# Patient Record
Sex: Male | Born: 1980 | Race: White | Hispanic: No | Marital: Married | Smoking: Former smoker
Health system: Southern US, Community
[De-identification: ages and names within clinical notes are randomized; demographics above are authoritative.]

## PROBLEM LIST (undated history)

## (undated) DIAGNOSIS — F319 Bipolar disorder, unspecified: Secondary | ICD-10-CM

## (undated) DIAGNOSIS — F410 Panic disorder [episodic paroxysmal anxiety] without agoraphobia: Secondary | ICD-10-CM

## (undated) DIAGNOSIS — F329 Major depressive disorder, single episode, unspecified: Secondary | ICD-10-CM

## (undated) DIAGNOSIS — F32A Depression, unspecified: Secondary | ICD-10-CM

## (undated) HISTORY — DX: Depression, unspecified: F32.A

## (undated) HISTORY — DX: Panic disorder (episodic paroxysmal anxiety): F41.0

## (undated) HISTORY — DX: Major depressive disorder, single episode, unspecified: F32.9

## (undated) HISTORY — PX: TONSILLECTOMY: SHX5217

## (undated) HISTORY — DX: Bipolar disorder, unspecified: F31.9

---

## 2004-10-06 ENCOUNTER — Emergency Department: Payer: Self-pay | Admitting: Unknown Physician Specialty

## 2006-02-06 ENCOUNTER — Emergency Department: Payer: Self-pay | Admitting: Emergency Medicine

## 2010-08-01 ENCOUNTER — Emergency Department (HOSPITAL_COMMUNITY): Admission: EM | Admit: 2010-08-01 | Discharge: 2010-08-01 | Payer: Self-pay | Admitting: Emergency Medicine

## 2010-08-06 ENCOUNTER — Encounter: Payer: Self-pay | Admitting: Gastroenterology

## 2010-08-06 ENCOUNTER — Ambulatory Visit: Payer: Self-pay | Admitting: Gastroenterology

## 2010-08-06 DIAGNOSIS — K219 Gastro-esophageal reflux disease without esophagitis: Secondary | ICD-10-CM

## 2010-08-06 DIAGNOSIS — K859 Acute pancreatitis without necrosis or infection, unspecified: Secondary | ICD-10-CM | POA: Insufficient documentation

## 2010-08-06 DIAGNOSIS — R1013 Epigastric pain: Secondary | ICD-10-CM

## 2010-08-07 ENCOUNTER — Encounter: Payer: Self-pay | Admitting: Gastroenterology

## 2010-08-09 ENCOUNTER — Encounter (INDEPENDENT_AMBULATORY_CARE_PROVIDER_SITE_OTHER): Payer: Self-pay | Admitting: *Deleted
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
## 2010-10-24 NOTE — Letter (Signed)
Summary: Recall Radiology  [facility]  [address]   [place], [place] [address]   Phone: [phone]  Fax: [phone]    August 09, 2010  JAXSUN CIAMPI [address] [place] [address] [place], [place]  [address] 06/22/1981   Dear Mr. BELLEROSE,   Our office needs to get you scheduled for your Upper Endoscopy. Please give our office a call to schedule this.  You may call the office at your convenience at [phone].  Please ask for the Referral Coordinator to make arrangements for this to be scheduled.  You may have to leave a message on our voice mail.  We will return your call.  If for any reason you do not wish to schedule this, please advise the office.  Please do not neglect your health.   Thank you,    Ave Filter  [facility] Ph: [phone]   Fax: [phone]

## 2010-10-24 NOTE — Letter (Signed)
Summary: ER REFERRAL  ER REFERRAL   Imported By: Rexene Alberts 08/07/2010 11:07:48  _____________________________________________________________________  External Attachment:    Type:   Image     Comment:   External Document

## 2010-10-24 NOTE — Miscellaneous (Signed)
Summary: Orders Update  Clinical Lists Changes  Orders: Added new Test order of T-Lipid Profile ([id number]) - Signed 

## 2010-12-04 LAB — DIFFERENTIAL
Eosinophils Absolute: 0.2 10*3/uL (ref 0.0–0.7)
Lymphs Abs: 2.9 10*3/uL (ref 0.7–4.0)
Monocytes Relative: 8 % (ref 3–12)
Neutrophils Relative %: 66 % (ref 43–77)

## 2010-12-04 LAB — LIPASE, BLOOD: Lipase: 80 U/L — ABNORMAL HIGH (ref 11–59)

## 2010-12-04 LAB — BASIC METABOLIC PANEL
CO2: 23 mEq/L (ref 19–32)
Calcium: 9.3 mg/dL (ref 8.4–10.5)
Chloride: 96 mEq/L (ref 96–112)
GFR calc Af Amer: >60 mL/min (ref >60)
Potassium: 3.5 mEq/L (ref 3.5–5.1)
Sodium: 139 mEq/L (ref 135–145)

## 2010-12-04 LAB — CBC
Hemoglobin: 15.8 g/dL (ref 13.0–17.0)
MCH: 32.1 pg (ref 26.0–34.0)
MCV: 94.3 fL (ref 78.0–100.0)
RBC: 4.93 MIL/uL (ref 4.22–5.81)

## 2010-12-04 LAB — HEPATIC FUNCTION PANEL
Albumin: 4.4 g/dL (ref 3.5–5.2)
Alkaline Phosphatase: 79 U/L (ref 39–117)
Total Bilirubin: 0.8 mg/dL (ref 0.3–1.2)

## 2013-04-13 ENCOUNTER — Ambulatory Visit: Payer: Self-pay | Admitting: Family Medicine

## 2013-12-27 ENCOUNTER — Emergency Department: Payer: Self-pay | Admitting: Emergency Medicine

## 2013-12-27 LAB — URINALYSIS, COMPLETE
BACTERIA: NONE SEEN
BILIRUBIN, UR: NEGATIVE
Blood: NEGATIVE
GLUCOSE, UR: NEGATIVE mg/dL (ref 0–75)
LEUKOCYTE ESTERASE: NEGATIVE
Nitrite: NEGATIVE
PH: 5 (ref 4.5–8.0)
PROTEIN: NEGATIVE
RBC, UR: NONE SEEN /HPF (ref 0–5)
SQUAMOUS EPITHELIAL: NONE SEEN
Specific Gravity: 1.017 (ref 1.003–1.030)
WBC UR: 1 /HPF (ref 0–5)

## 2013-12-27 LAB — DRUG SCREEN, URINE
Amphetamines, Ur Screen: NEGATIVE (ref ?–1000)
BENZODIAZEPINE, UR SCRN: NEGATIVE (ref ?–200)
Barbiturates, Ur Screen: NEGATIVE (ref ?–200)
CANNABINOID 50 NG, UR ~~LOC~~: NEGATIVE (ref ?–50)
COCAINE METABOLITE, UR ~~LOC~~: NEGATIVE (ref ?–300)
MDMA (Ecstasy)Ur Screen: NEGATIVE (ref ?–500)
METHADONE, UR SCREEN: NEGATIVE (ref ?–300)
Opiate, Ur Screen: NEGATIVE (ref ?–300)
Phencyclidine (PCP) Ur S: NEGATIVE (ref ?–25)
Tricyclic, Ur Screen: NEGATIVE (ref ?–1000)

## 2013-12-27 LAB — COMPREHENSIVE METABOLIC PANEL
ALBUMIN: 4.2 g/dL (ref 3.4–5.0)
ALK PHOS: 75 U/L
ALT: 40 U/L (ref 12–78)
ANION GAP: 7 (ref 7–16)
BUN: 11 mg/dL (ref 7–18)
Bilirubin,Total: 0.6 mg/dL (ref 0.2–1.0)
CHLORIDE: 106 mmol/L (ref 98–107)
CO2: 26 mmol/L (ref 21–32)
Calcium, Total: 9 mg/dL (ref 8.5–10.1)
Creatinine: 1.24 mg/dL (ref 0.60–1.30)
EGFR (African American): >60
Glucose: 109 mg/dL — ABNORMAL HIGH (ref 65–99)
Osmolality: 278 (ref 275–301)
Potassium: 3.6 mmol/L (ref 3.5–5.1)
SGOT(AST): 29 U/L (ref 15–37)
SODIUM: 139 mmol/L (ref 136–145)
Total Protein: 7.7 g/dL (ref 6.4–8.2)

## 2013-12-27 LAB — SALICYLATE LEVEL

## 2013-12-27 LAB — ETHANOL

## 2013-12-27 LAB — CBC
HCT: 43.6 % (ref 40.0–52.0)
HGB: 14.7 g/dL (ref 13.0–18.0)
MCH: 30.5 pg (ref 26.0–34.0)
MCHC: 33.7 g/dL (ref 32.0–36.0)
MCV: 91 fL (ref 80–100)
Platelet: 309 10*3/uL (ref 150–440)
RBC: 4.81 10*6/uL (ref 4.40–5.90)
RDW: 12.1 % (ref 11.5–14.5)
WBC: 9.9 10*3/uL (ref 3.8–10.6)

## 2013-12-27 LAB — ACETAMINOPHEN LEVEL

## 2014-08-04 LAB — CBC
HCT: 46.2 % (ref 40.0–52.0)
HGB: 15.3 g/dL (ref 13.0–18.0)
MCH: 30.9 pg (ref 26.0–34.0)
MCHC: 33.2 g/dL (ref 32.0–36.0)
MCV: 93 fL (ref 80–100)
Platelet: 298 10*3/uL (ref 150–440)
RBC: 4.96 10*6/uL (ref 4.40–5.90)
RDW: 12.3 % (ref 11.5–14.5)
WBC: 9.9 10*3/uL (ref 3.8–10.6)

## 2014-08-04 LAB — COMPREHENSIVE METABOLIC PANEL
ANION GAP: 7 (ref 7–16)
AST: 23 U/L (ref 15–37)
Albumin: 4.3 g/dL (ref 3.4–5.0)
Alkaline Phosphatase: 81 U/L
BUN: 11 mg/dL (ref 7–18)
Bilirubin,Total: 0.5 mg/dL (ref 0.2–1.0)
CO2: 27 mmol/L (ref 21–32)
CREATININE: 1.45 mg/dL — AB (ref 0.60–1.30)
Calcium, Total: 9.1 mg/dL (ref 8.5–10.1)
Chloride: 104 mmol/L (ref 98–107)
GLUCOSE: 115 mg/dL — AB (ref 65–99)
OSMOLALITY: 276 (ref 275–301)
Potassium: 3.6 mmol/L (ref 3.5–5.1)
SGPT (ALT): 50 U/L
SODIUM: 138 mmol/L (ref 136–145)
TOTAL PROTEIN: 7.9 g/dL (ref 6.4–8.2)

## 2014-08-04 LAB — DRUG SCREEN, URINE
Amphetamines, Ur Screen: NEGATIVE (ref ?–1000)
BENZODIAZEPINE, UR SCRN: NEGATIVE (ref ?–200)
Barbiturates, Ur Screen: NEGATIVE (ref ?–200)
CANNABINOID 50 NG, UR ~~LOC~~: NEGATIVE (ref ?–50)
COCAINE METABOLITE, UR ~~LOC~~: NEGATIVE (ref ?–300)
MDMA (Ecstasy)Ur Screen: NEGATIVE (ref ?–500)
Methadone, Ur Screen: NEGATIVE (ref ?–300)
Opiate, Ur Screen: NEGATIVE (ref ?–300)
Phencyclidine (PCP) Ur S: NEGATIVE (ref ?–25)
TRICYCLIC, UR SCREEN: NEGATIVE (ref ?–1000)

## 2014-08-04 LAB — URINALYSIS, COMPLETE
BILIRUBIN, UR: NEGATIVE
Bacteria: NONE SEEN
Blood: NEGATIVE
Glucose,UR: NEGATIVE mg/dL (ref 0–75)
Hyaline Cast: 3
KETONE: NEGATIVE
Leukocyte Esterase: NEGATIVE
Nitrite: NEGATIVE
PH: 6 (ref 4.5–8.0)
Specific Gravity: 1.024 (ref 1.003–1.030)
Squamous Epithelial: <1
WBC UR: 8 /HPF (ref 0–5)

## 2014-08-04 LAB — SALICYLATE LEVEL: Salicylates, Serum: <1.7 mg/dL

## 2014-08-04 LAB — ETHANOL

## 2014-08-04 LAB — ACETAMINOPHEN LEVEL: Acetaminophen: <2 ug/mL

## 2014-08-05 ENCOUNTER — Inpatient Hospital Stay: Payer: Self-pay | Admitting: Psychiatry

## 2014-08-05 LAB — LITHIUM LEVEL: Lithium: 0.23 mmol/L — ABNORMAL LOW

## 2014-08-06 LAB — URINE CULTURE

## 2014-08-09 LAB — LITHIUM LEVEL: Lithium: 0.58 mmol/L — ABNORMAL LOW

## 2014-08-09 LAB — LIPID PANEL
Cholesterol: 172 mg/dL
HDL Cholesterol: 27 mg/dL — ABNORMAL LOW
Ldl Cholesterol, Calc: 96 mg/dL
Triglycerides: 247 mg/dL — ABNORMAL HIGH
VLDL Cholesterol, Calc: 49 mg/dL — ABNORMAL HIGH

## 2014-08-09 LAB — HEMOGLOBIN A1C: HEMOGLOBIN A1C: 5.4 % (ref 4.2–6.3)
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
## 2015-03-08 ENCOUNTER — Ambulatory Visit (INDEPENDENT_AMBULATORY_CARE_PROVIDER_SITE_OTHER): Payer: Self-pay | Admitting: Psychiatry

## 2015-03-08 ENCOUNTER — Encounter: Payer: Self-pay | Admitting: Psychiatry

## 2015-03-08 VITALS — BP 123/78 | HR 67 | Resp 14 | Ht 72.0 in

## 2015-03-08 DIAGNOSIS — F3130 Bipolar disorder, current episode depressed, mild or moderate severity, unspecified: Secondary | ICD-10-CM

## 2015-03-08 MED ORDER — SERTRALINE HCL 100 MG PO TABS: 150.0000 mg | ORAL_TABLET | Freq: Every day | ORAL | Status: DC

## 2015-03-08 MED ORDER — ZOLPIDEM TARTRATE 10 MG PO TABS
10.0000 mg | ORAL_TABLET | Freq: Every evening | ORAL | Status: DC | PRN
Start: 2015-03-08 — End: 2015-03-08

## 2015-03-08 MED ORDER — LITHIUM CARBONATE ER 300 MG PO TBCR: 300.0000 mg | EXTENDED_RELEASE_TABLET | Freq: Three times a day (TID) | ORAL | Status: DC

## 2015-03-08 MED ORDER — ZOLPIDEM TARTRATE 10 MG PO TABS: 10.0000 mg | ORAL_TABLET | Freq: Every evening | ORAL | Status: DC | PRN

## 2015-03-08 NOTE — Progress Notes (Signed)
BH MD/PA/NP OP Progress Note  03/08/2015 11:36 AM Robert Holmes  MRN:  [id number]  Subjective:  Patient returns for follow-up of his bipolar disorder. He states he is continue take the lithium. He states he did run out of his Zoloft be in. He states his mood is continue to be good. He continues to remain in the same housing situation/rooming house. He states he socializing a bit with others and the rooming house. He states his appetite is been good and his sleep is good. He states he's been working out. He states he's also been trying to fill out job applications.  Symptoms such as delusions or hallucinations. He states he's been tolerating lithium well. He did state he never got the lithium level done as he is now on foot and does not have transportation. I have given him another laboratory slip for lithium level, basic metabolic panel and thyroid studies. I've explained him the purpose and need for these laboratory studies. Chief Complaint:  Visit Diagnosis:     ICD-9-CM ICD-10-CM   1. Bipolar I disorder, most recent episode depressed 296.50 F31.30 lithium carbonate (LITHOBID) 300 MG CR tablet     sertraline (ZOLOFT) 100 MG tablet    Past Medical History: No past medical history on file. No past surgical history on file. Family History: No family history on file. Social History:  History   Social History  . Marital Status: Married    Spouse Name: N/A  . Number of Children: N/A  . Years of Education: N/A   Social History Main Topics  . Smoking status: Not on file  . Smokeless tobacco: Not on file  . Alcohol Use: Not on file  . Drug Use: Not on file  . Sexual Activity: Not on file   Other Topics Concern  . Not on file   Social History Narrative  . No narrative on file   Additional History:   Assessment:   Musculoskeletal: Strength & Muscle Tone: within normal limits Gait & Station: normal Patient leans: N/A  Psychiatric Specialty Exam: HPI  Review of Systems   Psychiatric/Behavioral: Negative for depression, suicidal ideas, hallucinations, memory loss and substance abuse. The patient is not nervous/anxious and does not have insomnia.     Blood pressure 123/78, pulse 67, resp. rate 14, height 6' (1.829 m).There is no weight on file to calculate BMI.  General Appearance: Well Groomed  Eye Contact:  Good  Speech:  Clear and Coherent and Normal Rate  Volume:  Normal  Mood:  Good  Affect:  Appropriate and Congruent  Thought Process:  Linear and Logical  Orientation:  Full (Time, Place, and Person)  Thought Content:  Negative  Suicidal Thoughts:  No  Homicidal Thoughts:  No  Memory:  Immediate;   Good Recent;   Good Remote;   Good  Judgement:  Good  Insight:  Good  Psychomotor Activity:  Negative  Concentration:  Good  Recall:  Good  Fund of Knowledge: Good  Language: Good  Akathisia:  Negative  Handed:  Right unknown  AIMS (if indicated):  Not done  Assets:  Communication Skills Desire for Improvement Leisure Time  ADL's:  Intact  Cognition: WNL  Sleep:  Good   Is the patient at risk to self?  No. Has the patient been a risk to self in the past 6 months?  No. Has the patient been a risk to self within the distant past?  Yes.   Is the patient a risk to others?  No. Has the patient been a risk to others in the past 6 months?  No. Has the patient been a risk to others within the distant past?  Yes.    Current Medications: Current Outpatient Prescriptions  Medication Sig Dispense Refill  . lithium carbonate (LITHOBID) 300 MG CR tablet Take 1 tablet (300 mg total) by mouth 3 (three) times daily. 90 tablet 2  . sertraline (ZOLOFT) 100 MG tablet Take 1.5 tablets (150 mg total) by mouth daily. 45 tablet 2  . zolpidem (AMBIEN) 10 MG tablet Take 1 tablet (10 mg total) by mouth at bedtime as needed for sleep. 30 tablet 2   No current facility-administered medications for this visit.    Medical Decision Making:  Established Problem,  Stable/Improving (1)  Treatment Plan Summary:Medication management Patient has remained stable on the current regimen. We will continue on his lithium carbonate 300 mg 3 times a day, Zoloft 150 mg a day and Ambien 5 mg at bedtime as needed for insomnia. Patient has been given the laboratory slip to have lithium level and associated labs as discussed above. Follow up in 3 months. He is been encouraged call with any questions or concerns prior to his next appointment.  Wallace Going 03/08/2015, 11:36 AM

## 2015-06-08 ENCOUNTER — Ambulatory Visit: Payer: Self-pay | Admitting: Psychiatry

## 2015-07-06 ENCOUNTER — Other Ambulatory Visit
Admission: RE | Admit: 2015-07-06 | Discharge: 2015-07-06 | Disposition: A | Discharge status: Home / Self Care | Payer: Medicare Other | Source: Ambulatory Visit | Attending: Psychiatry | Admitting: Psychiatry

## 2015-07-06 ENCOUNTER — Encounter: Payer: Self-pay | Admitting: Psychiatry

## 2015-07-06 ENCOUNTER — Ambulatory Visit (INDEPENDENT_AMBULATORY_CARE_PROVIDER_SITE_OTHER): Payer: Medicare Other | Admitting: Psychiatry

## 2015-07-06 VITALS — BP 122/84 | HR 65 | Temp 98.2°F | Ht 71.0 in | Wt 229.2 lb

## 2015-07-06 DIAGNOSIS — F319 Bipolar disorder, unspecified: Secondary | ICD-10-CM | POA: Diagnosis present

## 2015-07-06 DIAGNOSIS — F3130 Bipolar disorder, current episode depressed, mild or moderate severity, unspecified: Secondary | ICD-10-CM

## 2015-07-06 DIAGNOSIS — Z79899 Other long term (current) drug therapy: Secondary | ICD-10-CM | POA: Diagnosis not present

## 2015-07-06 LAB — BASIC METABOLIC PANEL
Anion gap: 8 (ref 5–15)
BUN: 18 mg/dL (ref 6–20)
CHLORIDE: 104 mmol/L (ref 101–111)
CO2: 28 mmol/L (ref 22–32)
CREATININE: 1.23 mg/dL (ref 0.61–1.24)
Calcium: 9.4 mg/dL (ref 8.9–10.3)
GFR calc Af Amer: >60 mL/min (ref >60)
GFR calc non Af Amer: >60 mL/min (ref >60)
GLUCOSE: 92 mg/dL (ref 65–99)
POTASSIUM: 4.8 mmol/L (ref 3.5–5.1)
Sodium: 140 mmol/L (ref 135–145)

## 2015-07-06 LAB — TSH: TSH: 2.274 u[IU]/mL (ref 0.350–4.500)

## 2015-07-06 LAB — LITHIUM LEVEL: LITHIUM LVL: 0.06 mmol/L — AB (ref 0.60–1.20)

## 2015-07-06 MED ORDER — SERTRALINE HCL 100 MG PO TABS: 150.0000 mg | ORAL_TABLET | Freq: Every day | ORAL | Status: DC

## 2015-07-06 MED ORDER — ZOLPIDEM TARTRATE 10 MG PO TABS: 10.0000 mg | ORAL_TABLET | Freq: Every evening | ORAL | Status: DC | PRN

## 2015-07-06 MED ORDER — LITHIUM CARBONATE ER 300 MG PO TBCR: 300.0000 mg | EXTENDED_RELEASE_TABLET | Freq: Three times a day (TID) | ORAL | Status: DC

## 2015-07-06 NOTE — Progress Notes (Addendum)
BH MD/PA/NP OP Progress Note  07/06/2015 3:22 PM Robert Holmes  MRN:  [id number]  Subjective:  Patient returns for follow-up of his bipolar disorder. He states things continue to go well. He remains living at his rooming house and states that that situation has been good. He exercises regularly. He states he goes to church on Sundays. He states he's continued on his medications and feels they help him. In regards to mood he states that he might notice a mood swing that occurs for 15-30 minutes a day. However he denies any prolonged periods of depression or symptoms of mania. He denies any psychotic symptoms. Denies any suicidal ideation or any homicidal ideation. He states he is sleeping well and estimates he sleeps about 7 hours a night. He states he does not use the Ambien every night but might use it 3 times a week. Chief Complaint: nothing Chief Complaint    Follow-up; Medication Refill     Visit Diagnosis:     ICD-9-CM ICD-10-CM   1. Bipolar I disorder, most recent episode depressed (HCC) 296.50 F31.30 sertraline (ZOLOFT) 100 MG tablet     lithium carbonate (LITHOBID) 300 MG CR tablet    Past Medical History:  Past Medical History  Diagnosis Date  . Bipolar disorder (HCC)   . Panic attack   . Depression     Past Surgical History  Procedure Laterality Date  . Tonsillectomy     Family History:  Family History  Problem Relation Age of Onset  . Anxiety disorder Mother   . Depression Mother   . Panic disorder Mother   . Hypertension Father   . Stroke Father   . Diabetes Paternal Grandfather    Social History:  Social History   Social History  . Marital Status: Married    Spouse Name: N/A  . Number of Children: N/A  . Years of Education: N/A   Social History Main Topics  . Smoking status: Former Smoker    Types: Cigarettes    Start date: 07/05/1996    Quit date: 09/23/2014  . Smokeless tobacco: Former NeurosurgeonUser    Types: Snuff, Chew    Quit date: 07/05/1998  .  Alcohol Use: No  . Drug Use: No  . Sexual Activity: Not Currently   Other Topics Concern  . None   Social History Narrative   Additional History:   Assessment:   Musculoskeletal: Strength & Muscle Tone: within normal limits Gait & Station: normal Patient leans: N/A  Psychiatric Specialty Exam: HPI  Review of Systems  Psychiatric/Behavioral: Negative for depression, suicidal ideas, hallucinations, memory loss and substance abuse. The patient is not nervous/anxious and does not have insomnia.   All other systems reviewed and are negative.   Blood pressure 122/84, pulse 65, temperature 98.2 F (36.8 C), temperature source Tympanic, height 5\' 11"  (1.803 m), weight 229 lb 3.2 oz (103.964 kg), SpO2 96 %.Body mass index is 31.98 kg/(m^2).  General Appearance: Well Groomed  Eye Contact:  Good  Speech:  Clear and Coherent and Normal Rate  Volume:  Normal  Mood:  Good  Affect:  Appropriate and Congruent  Thought Process:  Linear and Logical  Orientation:  Full (Time, Place, and Person)  Thought Content:  Negative  Suicidal Thoughts:  No  Homicidal Thoughts:  No  Memory:  Immediate;   Good Recent;   Good Remote;   Good  Judgement:  Good  Insight:  Good  Psychomotor Activity:  Negative  Concentration:  Good  Recall:  Good  Fund of Knowledge: Good  Language: Good  Akathisia:  Negative  Handed:  Right unknown  AIMS (if indicated):  Not done  Assets:  Communication Skills Desire for Improvement Leisure Time  ADL's:  Intact  Cognition: WNL  Sleep:  Good   Is the patient at risk to self?  No. Has the patient been a risk to self in the past 6 months?  No. Has the patient been a risk to self within the distant past?  Yes.   Is the patient a risk to others?  No. Has the patient been a risk to others in the past 6 months?  No. Has the patient been a risk to others within the distant past?  Yes.    Current Medications: Current Outpatient Prescriptions  Medication Sig  Dispense Refill  . diphenhydrAMINE (BENADRYL) 25 mg capsule Take by mouth.    . lithium carbonate (LITHOBID) 300 MG CR tablet Take 1 tablet (300 mg total) by mouth 3 (three) times daily. 90 tablet 3  . sertraline (ZOLOFT) 100 MG tablet Take 1.5 tablets (150 mg total) by mouth daily. 45 tablet 3  . zolpidem (AMBIEN) 10 MG tablet Take 1 tablet (10 mg total) by mouth at bedtime as needed for sleep. 30 tablet 3   No current facility-administered medications for this visit.    Medical Decision Making:  Established Problem, Stable/Improving (1)  Treatment Plan Summary:Medication management  Bipolar disorder, most recent episode depressed Patient has remained stable on the current regimen. We will continue on his lithium carbonate 300 mg 3 times a day, Zoloft 150 mg a day. He did not have his lithium level drawn per my direction at the last visit. He cites that he does not has transportation and that this has interfered with his ability to present for the labs. However he indicated today took his lithium in the morning. Thus he has now been about 7-8 hours after his last dose. He states he's going to go to the lab after this appointment to get lithium level and associated studies.   Insomnia-patient states he's really been sleeping about 7 hours a night. Continue Ambien 10 mg at bedtime as needed for insomnia.    Follow up in 3 months. He is been encouraged call with any questions or concerns prior to his next appointment.  07/07/15-Order patient Zoloft and lithium, however prescriptions printed as oppose top being electronically prescribed. Will send electronically today.  Wallace Going 07/06/2015, 3:22 PM

## 2015-07-07 MED ORDER — SERTRALINE HCL 100 MG PO TABS: 150.0000 mg | ORAL_TABLET | Freq: Every day | ORAL | Status: DC

## 2015-07-07 MED ORDER — LITHIUM CARBONATE ER 300 MG PO TBCR: 300.0000 mg | EXTENDED_RELEASE_TABLET | Freq: Three times a day (TID) | ORAL | Status: DC

## 2015-07-07 NOTE — Addendum Note (Signed)
Addended by: Kerin SalenWILLIAMS, Kjell Brannen L on: 07/07/2015 09:44 AM   Modules accepted: Orders

## 2015-09-19 NOTE — Progress Notes (Signed)
Refilled- still takin 

## 2015-10-05 ENCOUNTER — Ambulatory Visit: Payer: Medicare Other | Admitting: Psychiatry

## 2015-10-06 ENCOUNTER — Ambulatory Visit (INDEPENDENT_AMBULATORY_CARE_PROVIDER_SITE_OTHER): Payer: Medicare Other | Admitting: Psychiatry

## 2015-10-06 ENCOUNTER — Encounter: Payer: Self-pay | Admitting: Psychiatry

## 2015-10-06 VITALS — BP 122/88 | HR 74 | Temp 97.8°F | Ht 71.0 in | Wt 235.4 lb

## 2015-10-06 DIAGNOSIS — F3130 Bipolar disorder, current episode depressed, mild or moderate severity, unspecified: Secondary | ICD-10-CM

## 2015-10-06 MED ORDER — SERTRALINE HCL 100 MG PO TABS: 150.0000 mg | ORAL_TABLET | Freq: Every day | ORAL | Status: DC

## 2015-10-06 MED ORDER — LITHIUM CARBONATE ER 300 MG PO TBCR: 600.0000 mg | EXTENDED_RELEASE_TABLET | Freq: Every day | ORAL | Status: DC

## 2015-10-06 NOTE — Progress Notes (Signed)
BH MD/PA/NP OP Progress Note  10/06/2015 2:28 PM Robert Holmes  MRN:  [id number]  Subjective:  Patient returns for follow-up of his bipolar disorder. He states things continue to go well. However he does state that the lithium he has been taking 1 300 mg tablet daily. His instructions were actually to take 1 tablet 3 times daily. This would explain his extremely low lithium level from his last labs. I told him that ideally for therapeutic dosing he will need at least 600 mg a day to address bipolar disorder. He indicated that when he was taking 3 he did not felt groggy and thus we have decided he is going to try to take 2 at bedtime. Dates is continuing to take the sertraline. He states overall his mood is been good. He states he does get a little frustrated but he states that this is surrounding things such as missing his children. When asked about the duration of this he states it might last only a day. He is continues to use coping skills of going to the gym and running Chief Complaint: nothing Chief Complaint    Follow-up; Medication Refill     Visit Diagnosis:     ICD-9-CM ICD-10-CM   1. Bipolar I disorder, most recent episode depressed (HCC) 296.50 F31.30 lithium carbonate (LITHOBID) 300 MG CR tablet     sertraline (ZOLOFT) 100 MG tablet     DISCONTINUED: lithium carbonate (LITHOBID) 300 MG CR tablet     DISCONTINUED: sertraline (ZOLOFT) 100 MG tablet    Past Medical History:  Past Medical History  Diagnosis Date  . Bipolar disorder (HCC)   . Panic attack   . Depression     Past Surgical History  Procedure Laterality Date  . Tonsillectomy     Family History:  Family History  Problem Relation Age of Onset  . Anxiety disorder Mother   . Depression Mother   . Panic disorder Mother   . Hypertension Father   . Stroke Father   . Diabetes Paternal Grandfather    Social History:  Social History   Social History  . Marital Status: Married    Spouse Name: N/A  . Number  of Children: N/A  . Years of Education: N/A   Social History Main Topics  . Smoking status: Former Smoker    Types: Cigarettes    Start date: 07/05/1996    Quit date: 09/23/2014  . Smokeless tobacco: Former NeurosurgeonUser    Types: Snuff, Chew    Quit date: 07/05/1998  . Alcohol Use: No  . Drug Use: No  . Sexual Activity: Not Currently   Other Topics Concern  . None   Social History Narrative   Additional History:   Assessment:   Musculoskeletal: Strength & Muscle Tone: within normal limits Gait & Station: normal Patient leans: N/A  Psychiatric Specialty Exam: HPI  Review of Systems  Psychiatric/Behavioral: Negative for depression, suicidal ideas, hallucinations, memory loss and substance abuse. The patient is not nervous/anxious and does not have insomnia.   All other systems reviewed and are negative.   Blood pressure 122/88, pulse 74, temperature 97.8 F (36.6 C), temperature source Tympanic, height 5\' 11"  (1.803 m), weight 235 lb 6.4 oz (106.777 kg), SpO2 94 %.Body mass index is 32.85 kg/(m^2).  General Appearance: Well Groomed  Eye Contact:  Good  Speech:  Clear and Coherent and Normal Rate  Volume:  Normal  Mood:  Good  Affect:  Appropriate and Congruent  Thought Process:  Linear  and Logical  Orientation:  Full (Time, Place, and Person)  Thought Content:  Negative  Suicidal Thoughts:  No  Homicidal Thoughts:  No  Memory:  Immediate;   Good Recent;   Good Remote;   Good  Judgement:  Good  Insight:  Good  Psychomotor Activity:  Negative  Concentration:  Good  Recall:  Good  Fund of Knowledge: Good  Language: Good  Akathisia:  Negative  Handed:  Right unknown  AIMS (if indicated):  Not done  Assets:  Communication Skills Desire for Improvement Leisure Time  ADL's:  Intact  Cognition: WNL  Sleep:  Good   Is the patient at risk to self?  No. Has the patient been a risk to self in the past 6 months?  No. Has the patient been a risk to self within the  distant past?  Yes.   Is the patient a risk to others?  No. Has the patient been a risk to others in the past 6 months?  No. Has the patient been a risk to others within the distant past?  Yes.    Current Medications: Current Outpatient Prescriptions  Medication Sig Dispense Refill  . diphenhydrAMINE (BENADRYL) 25 mg capsule Take by mouth.    . lithium carbonate (LITHOBID) 300 MG CR tablet Take 2 tablets (600 mg total) by mouth at bedtime. 60 tablet 4  . sertraline (ZOLOFT) 100 MG tablet Take 1.5 tablets (150 mg total) by mouth daily. 45 tablet 4   No current facility-administered medications for this visit.    Medical Decision Making:  Established Problem, Stable/Improving (1)  Treatment Plan Summary:Medication management  Bipolar disorder, most recent episode depressed Patient has remained stable on the current regimen. Continue Zoloft 150 mg a day. He was previously instructed to take 300 mg 3 times a day but stated that when he would take the second dose during the day he might feel a little groggy or sluggish. Thus we decided he is going to take 600 mg at bedtime and he will have his lithium level rechecked next week.  Insomnia-she has not been taking Ambien thus this will be discontinued. He is not having issues with insomnia anymore.   Follow up in 1 months. He is been encouraged call with any questions or concerns prior to his next appointment. He is aware of my departure from the clinic however he will see me again once more for follow-up.    Wallace Going 10/06/2015, 2:28 PM

## 2015-10-06 NOTE — Patient Instructions (Signed)
COME TO [facility] ON 10/12/15 or 10/13/15 and have labs done in the morning.

## 2015-10-12 ENCOUNTER — Other Ambulatory Visit
Admission: RE | Admit: 2015-10-12 | Discharge: 2015-10-12 | Disposition: A | Discharge status: Home / Self Care | Payer: Medicare Other | Source: Ambulatory Visit | Attending: Psychiatry | Admitting: Psychiatry

## 2015-10-12 DIAGNOSIS — F319 Bipolar disorder, unspecified: Secondary | ICD-10-CM | POA: Insufficient documentation

## 2015-10-12 DIAGNOSIS — Z79899 Other long term (current) drug therapy: Secondary | ICD-10-CM | POA: Diagnosis not present

## 2015-10-12 LAB — BASIC METABOLIC PANEL
Anion gap: 6 (ref 5–15)
BUN: 12 mg/dL (ref 6–20)
CHLORIDE: 102 mmol/L (ref 101–111)
CO2: 30 mmol/L (ref 22–32)
Calcium: 9.5 mg/dL (ref 8.9–10.3)
Creatinine, Ser: 1.04 mg/dL (ref 0.61–1.24)
GFR calc Af Amer: >60 mL/min (ref >60)
GFR calc non Af Amer: >60 mL/min (ref >60)
GLUCOSE: 107 mg/dL — AB (ref 65–99)
Potassium: 4.7 mmol/L (ref 3.5–5.1)
SODIUM: 138 mmol/L (ref 135–145)

## 2015-10-12 LAB — LITHIUM LEVEL: Lithium Lvl: <0.06 mmol/L — ABNORMAL LOW (ref 0.60–1.20)

## 2015-10-12 LAB — TSH: TSH: 1.879 u[IU]/mL (ref 0.350–4.500)

## 2015-10-19 ENCOUNTER — Telehealth: Payer: Self-pay | Admitting: Psychiatry

## 2015-10-19 NOTE — Telephone Encounter (Signed)
Patient has no telephone as he lives in a rooming house. We are going to send him a letter discussing his lithium-related laboratory results. AW

## 2015-10-24 NOTE — Telephone Encounter (Signed)
letter was certified mail sent out yesterday

## 2015-11-02 ENCOUNTER — Ambulatory Visit (INDEPENDENT_AMBULATORY_CARE_PROVIDER_SITE_OTHER): Payer: Medicare Other | Admitting: Psychiatry

## 2015-11-02 ENCOUNTER — Encounter: Payer: Self-pay | Admitting: Psychiatry

## 2015-11-02 VITALS — BP 120/82 | HR 65 | Temp 97.3°F | Ht 71.0 in | Wt 241.2 lb

## 2015-11-02 DIAGNOSIS — F3130 Bipolar disorder, current episode depressed, mild or moderate severity, unspecified: Secondary | ICD-10-CM

## 2015-11-02 NOTE — Progress Notes (Signed)
BH MD/PA/NP OP Progress Note  11/02/2015 12:19 PM Robert Holmes  MRN:  [id number]  Subjective:  Patient returns for follow-up of his bipolar disorder. Asian states things continue to go well with him. He denies any mood disturbance or psychotic symptoms. He continues in his current living situation. He continues take the Zoloft and lithium. At the last visit it was discovered that he was taking 1 300 mg dose despite being ordered to take 300 mg 3 times a day. In [place] daytime doses were making him sleepy and thus we decided he would take 600 mg at bedtime. We rechecked his lithium level and it was still undetectable. Patient insists he is taking his medication. He has been forthcoming with what he is doing with his medicines as noted above and thus I tend to believe him. It is unusual that he has such a low lithium level but he has been compliant with treatment and appointments and feels the current regimen is working well for him. Chief Complaint: nothing Chief Complaint    Follow-up; Medication Refill     Visit Diagnosis:     ICD-9-CM ICD-10-CM   1. Bipolar I disorder, most recent episode depressed (HCC) 296.50 F31.30     Past Medical History:  Past Medical History  Diagnosis Date  . Bipolar disorder (HCC)   . Panic attack   . Depression     Past Surgical History  Procedure Laterality Date  . Tonsillectomy     Family History:  Family History  Problem Relation Age of Onset  . Anxiety disorder Mother   . Depression Mother   . Panic disorder Mother   . Hypertension Father   . Stroke Father   . Diabetes Paternal Grandfather    Social History:  Social History   Social History  . Marital Status: Married    Spouse Name: N/A  . Number of Children: N/A  . Years of Education: N/A   Social History Main Topics  . Smoking status: Former Smoker    Types: Cigarettes    Start date: 07/05/1996    Quit date: 09/23/2014  . Smokeless tobacco: Former Neurosurgeon    Types: Snuff, Chew     Quit date: 07/05/1998  . Alcohol Use: No  . Drug Use: No  . Sexual Activity: Not Currently   Other Topics Concern  . None   Social History Narrative   Additional History:   Assessment:   Musculoskeletal: Strength & Muscle Tone: within normal limits Gait & Station: normal Patient leans: N/A  Psychiatric Specialty Exam: HPI  Review of Systems  Psychiatric/Behavioral: Negative for depression, suicidal ideas, hallucinations, memory loss and substance abuse. The patient is not nervous/anxious and does not have insomnia.   All other systems reviewed and are negative.   Blood pressure 120/82, pulse 65, temperature 97.3 F (36.3 C), temperature source Tympanic, height  (1.803 m), weight 241 lb 3.2 oz (109.408 kg), SpO2 96 %.Body mass index is 33.66 kg/(m^2).  General Appearance: Well Groomed  Eye Contact:  Good  Speech:  Clear and Coherent and Normal Rate  Volume:  Normal  Mood:  Good  Affect:  Appropriate and Congruent  Thought Process:  Linear and Logical  Orientation:  Full (Time, Place, and Person)  Thought Content:  Negative  Suicidal Thoughts:  No  Homicidal Thoughts:  No  Memory:  Immediate;   Good Recent;   Good Remote;   Good  Judgement:  Good  Insight:  Good  Psychomotor Activity:  Negative  Concentration:  Good  Recall:  Good  Fund of Knowledge: Good  Language: Good  Akathisia:  Negative  Handed:  Right unknown  AIMS (if indicated):  Not done  Assets:  Communication Skills Desire for Improvement Leisure Time  ADL's:  Intact  Cognition: WNL  Sleep:  Good   Is the patient at risk to self?  No. Has the patient been a risk to self in the past 6 months?  No. Has the patient been a risk to self within the distant past?  Yes.   Is the patient a risk to others?  No. Has the patient been a risk to others in the past 6 months?  No. Has the patient been a risk to others within the distant past?  Yes.    Current Medications: Current Outpatient  Prescriptions  Medication Sig Dispense Refill  . diphenhydrAMINE (BENADRYL) 25 mg capsule Take by mouth.    . lithium carbonate (LITHOBID) 300 MG CR tablet Take 2 tablets (600 mg total) by mouth at bedtime. 60 tablet 4  . sertraline (ZOLOFT) 100 MG tablet Take 1.5 tablets (150 mg total) by mouth daily. 45 tablet 4   No current facility-administered medications for this visit.    Medical Decision Making:  Established Problem, Stable/Improving (1)  Treatment Plan Summary:Medication management  Bipolar disorder, most recent episode depressed Patient has remained stable on the current regimen. Continue Zoloft 150 mg a day. Continue lithium carbonate 600 mg at bedtime.  Insomnia-she has not been taking Ambien thus this will be discontinued. He is not having issues with insomnia anymore.  His labs from 10/12/2015 were lithium level less than 0.06, TSH normal at 1.875 and his BUN/creatinine were within normal limits.   Follow up in 2 months. He is been encouraged call with any questions or concerns prior to his next appointment. He is aware he will see a new psychiatrist at the clinic at his next visit.    Wallace Going 11/02/2015, 12:19 PM

## 2016-01-01 ENCOUNTER — Ambulatory Visit (INDEPENDENT_AMBULATORY_CARE_PROVIDER_SITE_OTHER): Payer: Medicare Other | Admitting: Psychiatry

## 2016-01-01 ENCOUNTER — Encounter: Payer: Self-pay | Admitting: Psychiatry

## 2016-01-01 VITALS — BP 128/84 | HR 75 | Temp 98.2°F | Ht 71.0 in | Wt 240.4 lb

## 2016-01-01 DIAGNOSIS — F3130 Bipolar disorder, current episode depressed, mild or moderate severity, unspecified: Secondary | ICD-10-CM | POA: Diagnosis not present

## 2016-01-01 MED ORDER — SERTRALINE HCL 100 MG PO TABS: 150.0000 mg | ORAL_TABLET | Freq: Every day | ORAL | Status: DC

## 2016-01-01 MED ORDER — LITHIUM CARBONATE ER 300 MG PO TBCR: 600.0000 mg | EXTENDED_RELEASE_TABLET | Freq: Every day | ORAL | Status: DC

## 2016-01-01 NOTE — Progress Notes (Signed)
Patient ID: Robert Holmes, male   DOB: 11-Oct-1980, 35 y.o.   MRN: [id number] [facility] MD/PA/NP OP Progress Note  01/01/2016 10:41 AM Robert Holmes  MRN:  [id number]  Subjective:  Patient returns for follow-up of his bipolar disorder. Patient was previously seen by Dr. Mayford Knife and this is the first visit for this patient with this clinician. Patient states that he is been doing alright. He states is compliant with his medications. States that he now lives by himself in an apartment and does some are in jobs and also gets disability. He denies using drugs or drinking alcohol. States that he has a decent relationship with mom but a poor relation with the stepfather. Denies any manic symptoms. Denies any suicidal thoughts. States he is going to the church regularly and enjoys going to [employer]. Since states he keeps himself busy.    Chief Complaint: nothing Chief Complaint    Follow-up; Medication Refill; Anxiety     Visit Diagnosis:   No diagnosis found.  Past Medical History:  Past Medical History  Diagnosis Date  . Bipolar disorder (HCC)   . Panic attack   . Depression     Past Surgical History  Procedure Laterality Date  . Tonsillectomy     Family History:  Family History  Problem Relation Age of Onset  . Anxiety disorder Mother   . Depression Mother   . Panic disorder Mother   . Hypertension Father   . Stroke Father   . Diabetes Paternal Grandfather    Social History:  Social History   Social History  . Marital Status: Married    Spouse Name: N/A  . Number of Children: N/A  . Years of Education: N/A   Social History Main Topics  . Smoking status: Former Smoker    Types: Cigarettes    Start date: 07/05/1996    Quit date: 09/23/2014  . Smokeless tobacco: Former Neurosurgeon    Types: Snuff, Chew    Quit date: 07/05/1998  . Alcohol Use: No  . Drug Use: No  . Sexual Activity: Not Currently   Other Topics Concern  . None   Social History Narrative    Additional History:   Assessment:   Musculoskeletal: Strength & Muscle Tone: within normal limits Gait & Station: normal Patient leans: N/A  Psychiatric Specialty Exam: Anxiety Patient reports no insomnia, nervous/anxious behavior or suicidal ideas.      Review of Systems  Psychiatric/Behavioral: Negative for depression, suicidal ideas, hallucinations, memory loss and substance abuse. The patient is not nervous/anxious and does not have insomnia.   All other systems reviewed and are negative.   Blood pressure 128/84, pulse 75, temperature 98.2 F (36.8 C), temperature source Tympanic, height  (1.803 m), weight 240 lb 6.4 oz (109.045 kg), SpO2 95 %.Body mass index is 33.54 kg/(m^2).  General Appearance: Well Groomed  Eye Contact:  Good  Speech:  Clear and Coherent and Normal Rate  Volume:  Normal  Mood:  Good  Affect:  Appropriate and Congruent  Thought Process:  Linear and Logical  Orientation:  Full (Time, Place, and Person)  Thought Content:  Negative  Suicidal Thoughts:  No  Homicidal Thoughts:  No  Memory:  Immediate;   Good Recent;   Good Remote;   Good  Judgement:  Good  Insight:  Good  Psychomotor Activity:  Negative  Concentration:  Good  Recall:  Good  Fund of Knowledge: Good  Language: Good  Akathisia:  Negative  Handed:  Right unknown  AIMS (if indicated):  Not done  Assets:  Communication Skills Desire for Improvement Leisure Time  ADL's:  Intact  Cognition: WNL  Sleep:  Good   Is the patient at risk to self?  No. Has the patient been a risk to self in the past 6 months?  No. Has the patient been a risk to self within the distant past?  Yes.   Is the patient a risk to others?  No. Has the patient been a risk to others in the past 6 months?  No. Has the patient been a risk to others within the distant past?  Yes.    Current Medications: Current Outpatient Prescriptions  Medication Sig Dispense Refill  . lithium carbonate (LITHOBID)  300 MG CR tablet Take 2 tablets (600 mg total) by mouth at bedtime. 60 tablet 4  . sertraline (ZOLOFT) 100 MG tablet Take 1.5 tablets (150 mg total) by mouth daily. 45 tablet 4   No current facility-administered medications for this visit.    Medical Decision Making:  Established Problem, Stable/Improving (1)  Treatment Plan Summary:Medication management  Bipolar disorder, most recent episode depressed Patient has remained stable on the current regimen. Continue Zoloft 150 mg a day. Continue lithium carbonate 600 mg at bedtime. Patient's lithium levels have always been very subtherapeutic around 0.06. Patient however reports being compliant with the lithium. We will obtain labs in another 3 months.   Follow up in 2 months. He is been encouraged call with any questions or concerns prior to his next appointment.   Robert Holmes 01/01/2016, 10:41 AM

## 2016-04-01 ENCOUNTER — Ambulatory Visit (INDEPENDENT_AMBULATORY_CARE_PROVIDER_SITE_OTHER): Payer: Medicare Other | Admitting: Psychiatry

## 2016-04-01 DIAGNOSIS — F3130 Bipolar disorder, current episode depressed, mild or moderate severity, unspecified: Secondary | ICD-10-CM | POA: Diagnosis not present

## 2016-04-01 MED ORDER — LITHIUM CARBONATE ER 300 MG PO TBCR: 600.0000 mg | EXTENDED_RELEASE_TABLET | Freq: Every day | ORAL | Status: DC

## 2016-04-01 MED ORDER — SERTRALINE HCL 100 MG PO TABS: 150.0000 mg | ORAL_TABLET | Freq: Every day | ORAL | Status: DC

## 2016-04-01 NOTE — Progress Notes (Signed)
Patient ID: Robert Holmes, male   DOB: 1981-09-14, 35 y.o.   MRN: [id number]  [facility] MD/PA/NP OP Progress Note  04/01/2016 10:28 AM Robert Holmes  MRN:  [id number]  Subjective:  Patient returns for follow-up of his bipolar disorder. Patient presents with his mother today for a follow-up. He states that he is upset today because he got a letter stating that he would no longer be getting any disability starting this month. The letter states that his case has been reviewed in detail and it has been determined that he can start to work full-time. Patient states that he keeps going to the church and has some odd jobs but the has not kept a job in a long time. Per mom he does have some mood instability a few times a month when he gets very angry. They deny any family history of bipolar disorder schizophrenia. Patient is not having any psychotic symptoms currently. He does not have any manic symptoms. He is somewhat anxious and down due to the letter from mom and disability services. 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 On and off since then. States that he has been fully sober for 2 years. He is currently not in any treatment for his substance abuse. 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 States that it would be due very difficult for him without the disability check. Denies any suicidal thoughts. Implant with his medications and states they're helpful in keeping his mood stable.  Chief Complaint: Upset about his disability services discontinuing  Visit Diagnosis:     ICD-9-CM ICD-10-CM   1. Bipolar I disorder, most recent episode depressed (HCC) 296.50 F31.30     Past Medical History:  Past Medical History  Diagnosis Date  . Bipolar disorder (HCC)   . Panic attack   . Depression     Past Surgical History  Procedure Laterality Date  .  Tonsillectomy     Family History:  Family History  Problem Relation Age of Onset  . Anxiety disorder Mother   . Depression Mother   . Panic disorder Mother   . Hypertension Father   . Stroke Father   . Diabetes Paternal Grandfather    Social History:  Social History   Social History  . Marital Status: Married    Spouse Name: N/A  . Number of Children: N/A  . Years of Education: N/A   Social History Main Topics  . Smoking status: Former Smoker    Types: Cigarettes    Start date: 07/05/1996    Quit date: 09/23/2014  . Smokeless tobacco: Former Neurosurgeon    Types: Snuff, Chew    Quit date: 07/05/1998  . Alcohol Use: No  . Drug Use: No  . Sexual Activity: Not Currently   Other Topics Concern  . Not on file   Social History Narrative   Additional History:   Assessment:   Musculoskeletal: Strength & Muscle Tone: within normal limits Gait & Station: normal Patient leans: N/A  Psychiatric Specialty Exam: Anxiety Patient reports no insomnia, nervous/anxious behavior or suicidal ideas.      Review of Systems  Psychiatric/Behavioral: Negative for depression, suicidal ideas, hallucinations, memory loss and substance abuse. The patient is not nervous/anxious and does not have insomnia.   All other systems reviewed and are negative.   There were no  vitals taken for this visit.There is no weight on file to calculate BMI.  General Appearance: Well Groomed  Eye Contact:  Good  Speech:  Clear and Coherent and Normal Rate  Volume:  Normal  Mood:  Upset   Affect:  Anxious   Thought Process:  Linear and Logical  Orientation:  Full (Time, Place, and Person)  Thought Content:  Negative  Suicidal Thoughts:  No  Homicidal Thoughts:  No  Memory:  Immediate;   Good Recent;   Good Remote;   Good  Judgement:  Good  Insight:  Good  Psychomotor Activity:  Negative  Concentration:  Good  Recall:  Good  Fund of Knowledge: Good  Language: Good  Akathisia:  Negative  Handed:   Right unknown  AIMS (if indicated):  Not done  Assets:  Communication Skills Desire for Improvement Leisure Time  ADL's:  Intact  Cognition: WNL  Sleep:  Good   Is the patient at risk to self?  No. Has the patient been a risk to self in the past 6 months?  No. Has the patient been a risk to self within the distant past?  Yes.   Is the patient a risk to others?  No. Has the patient been a risk to others in the past 6 months?  No. Has the patient been a risk to others within the distant past?  Yes.    Current Medications: Current Outpatient Prescriptions  Medication Sig Dispense Refill  . lithium carbonate (LITHOBID) 300 MG CR tablet Take 2 tablets (600 mg total) by mouth at bedtime. 60 tablet 4  . sertraline (ZOLOFT) 100 MG tablet Take 1.5 tablets (150 mg total) by mouth daily. 45 tablet 4   No current facility-administered medications for this visit.    Medical Decision Making:  Established Problem, Stable/Improving (1)  Treatment Plan Summary:Medication management  Bipolar disorder, most recent episode depressed Patient has remained stable on the current regimen. Continue Zoloft 150 mg a day. Continue lithium carbonate 600 mg at bedtime. Recent labs within normal limits.  Patient requesting that this clinician on May the recommendations for continued disability services. It was discussed with him and his mother that since patient has been stable for a long time and given that his mood instability has been caused by substance abuse in the past he needs to continue with substance abuse services. Discussed with them that patient is currently able to work and he will need to pursue some type of work to substitute for his disability check. Patient was given the resources to be [facility]  since he may be losing his insurance soon.   Follow up in 3 months if he continues to have this insurance or he can follow up at the above facilities.  Robert Holmes,  Robert Holmes 04/01/2016, 10:28 AM

## 2016-10-21 ENCOUNTER — Ambulatory Visit: Payer: Self-pay | Admitting: Family Medicine

## 2017-01-22 ENCOUNTER — Other Ambulatory Visit: Payer: Self-pay | Admitting: Psychiatry

## 2017-01-22 DIAGNOSIS — F3130 Bipolar disorder, current episode depressed, mild or moderate severity, unspecified: Secondary | ICD-10-CM

## 2019-09-24 ENCOUNTER — Encounter: Payer: Self-pay | Admitting: Emergency Medicine

## 2019-09-24 ENCOUNTER — Emergency Department: Payer: BC Managed Care – PPO

## 2019-09-24 ENCOUNTER — Emergency Department
Admission: EM | Admit: 2019-09-24 | Discharge: 2019-09-24 | Disposition: A | Discharge status: Home / Self Care | Payer: BC Managed Care – PPO | Attending: Emergency Medicine | Admitting: Emergency Medicine

## 2019-09-24 ENCOUNTER — Other Ambulatory Visit: Payer: Self-pay

## 2019-09-24 DIAGNOSIS — R609 Edema, unspecified: Secondary | ICD-10-CM | POA: Insufficient documentation

## 2019-09-24 DIAGNOSIS — R42 Dizziness and giddiness: Secondary | ICD-10-CM | POA: Insufficient documentation

## 2019-09-24 DIAGNOSIS — R5383 Other fatigue: Secondary | ICD-10-CM | POA: Insufficient documentation

## 2019-09-24 DIAGNOSIS — Z79899 Other long term (current) drug therapy: Secondary | ICD-10-CM | POA: Insufficient documentation

## 2019-09-24 DIAGNOSIS — Z87891 Personal history of nicotine dependence: Secondary | ICD-10-CM | POA: Insufficient documentation

## 2019-09-24 DIAGNOSIS — R0602 Shortness of breath: Secondary | ICD-10-CM | POA: Diagnosis present

## 2019-09-24 LAB — COMPREHENSIVE METABOLIC PANEL
ALT: 26 U/L (ref 0–44)
AST: 24 U/L (ref 15–41)
Albumin: 4.3 g/dL (ref 3.5–5.0)
Alkaline Phosphatase: 47 U/L (ref 38–126)
Anion gap: 14 (ref 5–15)
BUN: 18 mg/dL (ref 6–20)
CO2: 25 mmol/L (ref 22–32)
Calcium: 9.1 mg/dL (ref 8.9–10.3)
Chloride: 101 mmol/L (ref 98–111)
Creatinine, Ser: 1.24 mg/dL (ref 0.61–1.24)
GFR calc Af Amer: >60 mL/min (ref >60)
GFR calc non Af Amer: >60 mL/min (ref >60)
Glucose, Bld: 111 mg/dL — ABNORMAL HIGH (ref 70–99)
Potassium: 3.4 mmol/L — ABNORMAL LOW (ref 3.5–5.1)
Sodium: 140 mmol/L (ref 135–145)
Total Bilirubin: 1.2 mg/dL (ref 0.3–1.2)
Total Protein: 7.1 g/dL (ref 6.5–8.1)

## 2019-09-24 LAB — CBC WITH DIFFERENTIAL/PLATELET
Abs Immature Granulocytes: 0.02 10*3/uL (ref 0.00–0.07)
Basophils Absolute: 0.1 10*3/uL (ref 0.0–0.1)
Basophils Relative: 2 %
Eosinophils Absolute: 0.1 10*3/uL (ref 0.0–0.5)
Eosinophils Relative: 2 %
HCT: 38.7 % — ABNORMAL LOW (ref 39.0–52.0)
Hemoglobin: 13.5 g/dL (ref 13.0–17.0)
Immature Granulocytes: 0 %
Lymphocytes Relative: 23 %
Lymphs Abs: 1.7 10*3/uL (ref 0.7–4.0)
MCH: 30.7 pg (ref 26.0–34.0)
MCHC: 34.9 g/dL (ref 30.0–36.0)
MCV: 88 fL (ref 80.0–100.0)
Monocytes Absolute: 0.9 10*3/uL (ref 0.1–1.0)
Monocytes Relative: 13 %
Neutro Abs: 4.6 10*3/uL (ref 1.7–7.7)
Neutrophils Relative %: 60 %
Platelets: 290 10*3/uL (ref 150–400)
RBC: 4.4 MIL/uL (ref 4.22–5.81)
RDW: 11.7 % (ref 11.5–15.5)
WBC: 7.5 10*3/uL (ref 4.0–10.5)
nRBC: 0 % (ref 0.0–0.2)

## 2019-09-24 LAB — TROPONIN I (HIGH SENSITIVITY): Troponin I (High Sensitivity): 7 ng/L (ref <18)

## 2019-09-24 NOTE — ED Triage Notes (Signed)
Pt to triage via w/c with no distress noted, mask in place; pt falling asleep during triage frequently; pt reports SHOB 3-4 days accomp by dizziness

## 2019-09-24 NOTE — ED Provider Notes (Signed)
[facility] Emergency Department Provider Note   ____________________________________________    I have reviewed the triage vital signs and the nursing notes.   HISTORY  Chief Complaint Dizziness and Shortness of Breath     HPI Robert Holmes is a 39 y.o. male who presents with complaints of fatigue, dizziness, intermittent shortness of breath over the last 1 to 2 days.  He also notes some very mild swelling in his lower extremities which he attributes to being on his feet nearly continuously at work for several days now.  He denies fevers or chills.  No loss of taste or smell.  No chest pain.  No palpitations.  Has never had this before.  No cough.  Has a history as below  Past Medical History:  Diagnosis Date  . Bipolar disorder ([place])   . Depression   . Panic attack     Patient Active Problem List   Diagnosis Date Noted  . GERD 08/06/2010  . PANCREATITIS 08/06/2010  . EPIGASTRIC PAIN 08/06/2010    Past Surgical History:  Procedure Laterality Date  . TONSILLECTOMY      Prior to Admission medications   Medication Sig Start Date End Date Taking? Authorizing Provider  lithium carbonate (LITHOBID) 300 MG CR tablet Take 2 tablets (600 mg total) by mouth at bedtime. 04/01/16   Elvin So, MD  sertraline (ZOLOFT) 100 MG tablet Take 1.5 tablets (150 mg total) by mouth daily. 04/01/16   Elvin So, MD     Allergies Patient has no known allergies.  Family History  Problem Relation Age of Onset  . Anxiety disorder Mother   . Depression Mother   . Panic disorder Mother   . Hypertension Father   . Stroke Father   . Diabetes Paternal Grandfather     Social History Social History   Tobacco Use  . Smoking status: Former Smoker    Types: Cigarettes    Start date: 07/05/1996    Quit date: 09/23/2014    Years since quitting: 5.0  . Smokeless tobacco: Former User    Types: Snuff, Sarina Ser    Quit date: 07/05/1998  Substance Use  Topics  . Alcohol use: No    Alcohol/week: 0.0 standard drinks  . Drug use: No    Review of Systems  Constitutional: As above Eyes: No visual changes.  ENT: No sore throat. Cardiovascular: Denies chest pain. Respiratory: As above Gastrointestinal: No abdominal pain.    Genitourinary: Negative for dysuria. Musculoskeletal: As above Skin: Negative for rash. Neurological: Negative for headaches   ____________________________________________   PHYSICAL EXAM:  VITAL SIGNS: ED Triage Vitals  Enc Vitals Group     BP 09/24/19 0408 134/80     Pulse Rate 09/24/19 0408 75     Resp 09/24/19 0408 18     Temp 09/24/19 0408 99 F (37.2 C)     Temp Source 09/24/19 0408 Oral     SpO2 09/24/19 0408 99 %     Weight 09/24/19 0416 90.7 kg (200 lb)     Height 09/24/19 0416 1.803 m (5\' 11" )     Head Circumference --      Peak Flow --      Pain Score 09/24/19 0415 0     Pain Loc --      Pain Edu? --      Excl. in [place]? --     Constitutional: Alert and oriented. No acute distress.  Nose: No congestion/rhinnorhea. Mouth/Throat: Mucous membranes are  moist.    Cardiovascular: Normal rate, regular rhythm. Grossly normal heart sounds.  Good peripheral circulation. Respiratory: Normal respiratory effort.  No retractions. Lungs CTAB. Gastrointestinal: Soft and nontender. No distention.   Musculoskeletal: Very minimal edema to the lower extremities, to mid shin Neurologic:  Normal speech and language. No gross focal neurologic deficits are appreciated.  Skin:  Skin is warm, dry and intact. No rash noted. Psychiatric: Mood and affect are normal. Speech and behavior are normal.  ____________________________________________   LABS (all labs ordered are listed, but only abnormal results are displayed)  Labs Reviewed  CBC WITH DIFFERENTIAL/PLATELET - Abnormal; Notable for the following components:      Result Value   HCT 38.7 (*)    All other components within normal limits    COMPREHENSIVE METABOLIC PANEL - Abnormal; Notable for the following components:   Potassium 3.4 (*)    Glucose, Bld 111 (*)    All other components within normal limits  TROPONIN I (HIGH SENSITIVITY)   ____________________________________________  EKG  ED ECG REPORT I, Jene Every, the attending physician, personally viewed and interpreted this ECG.  Date: 09/24/2019  Rhythm: normal sinus rhythm QRS Axis: normal Intervals: normal ST/T Wave abnormalities: normal Narrative Interpretation: no evidence of acute ischemia  ____________________________________________  RADIOLOGY  Chest x-ray unremarkable ____________________________________________   PROCEDURES  Procedure(s) performed: No  Procedures   Critical Care performed: No ____________________________________________   INITIAL IMPRESSION / ASSESSMENT AND PLAN / ED COURSE  Pertinent labs & imaging results that were available during my care of the patient were reviewed by me and considered in my medical decision making (see chart for details).  Patient presents with fatigue, mild shortness of breath, mild lower extremity edema.  Overall however he is well-appearing and in no acute distress.  He attributes most of this to working too much and is asking for time off of work.  Doubt novel coronavirus given reassuring labs, chest x-ray, afebrile.  EKG is reassuring, lab work is benign.  Chest x-ray is clear.  Vital signs are reassuring.  No increased work of breathing.  We will have the patient follow-up closely with his PCP for further work-up    ____________________________________________   FINAL CLINICAL IMPRESSION(S) / ED DIAGNOSES  Final diagnoses:  Shortness of breath  Peripheral edema        Note:  This document was prepared using Dragon voice recognition software and may include unintentional dictation errors.   Jene Every, MD 09/24/19 [id number]

## 2020-06-26 ENCOUNTER — Other Ambulatory Visit: Payer: Self-pay

## 2020-06-26 ENCOUNTER — Emergency Department
Admission: EM | Admit: 2020-06-26 | Discharge: 2020-06-28 | Disposition: A | Discharge status: Other Acute Inpatient Hospital | Payer: BC Managed Care – PPO | Attending: Emergency Medicine | Admitting: Emergency Medicine

## 2020-06-26 DIAGNOSIS — F329 Major depressive disorder, single episode, unspecified: Secondary | ICD-10-CM | POA: Diagnosis not present

## 2020-06-26 DIAGNOSIS — E876 Hypokalemia: Secondary | ICD-10-CM | POA: Diagnosis not present

## 2020-06-26 DIAGNOSIS — F410 Panic disorder [episodic paroxysmal anxiety] without agoraphobia: Secondary | ICD-10-CM | POA: Insufficient documentation

## 2020-06-26 DIAGNOSIS — Z87891 Personal history of nicotine dependence: Secondary | ICD-10-CM | POA: Insufficient documentation

## 2020-06-26 DIAGNOSIS — Z20822 Contact with and (suspected) exposure to covid-19: Secondary | ICD-10-CM | POA: Insufficient documentation

## 2020-06-26 DIAGNOSIS — R443 Hallucinations, unspecified: Secondary | ICD-10-CM | POA: Diagnosis not present

## 2020-06-26 DIAGNOSIS — F32A Depression, unspecified: Secondary | ICD-10-CM

## 2020-06-26 LAB — URINALYSIS, COMPLETE (UACMP) WITH MICROSCOPIC
Bacteria, UA: NONE SEEN
Bilirubin Urine: NEGATIVE
Glucose, UA: NEGATIVE mg/dL
Hgb urine dipstick: NEGATIVE
Ketones, ur: 80 mg/dL — AB
Leukocytes,Ua: NEGATIVE
Nitrite: NEGATIVE
Protein, ur: 30 mg/dL — AB
Specific Gravity, Urine: 1.03 (ref 1.005–1.030)
Squamous Epithelial / LPF: NONE SEEN (ref 0–5)
pH: 5 (ref 5.0–8.0)

## 2020-06-26 LAB — URINE DRUG SCREEN, QUALITATIVE (ARMC ONLY)
Amphetamines, Ur Screen: NOT DETECTED
Barbiturates, Ur Screen: NOT DETECTED
Benzodiazepine, Ur Scrn: NOT DETECTED
Cannabinoid 50 Ng, Ur ~~LOC~~: NOT DETECTED
Cocaine Metabolite,Ur ~~LOC~~: NOT DETECTED
MDMA (Ecstasy)Ur Screen: NOT DETECTED
Methadone Scn, Ur: NOT DETECTED
Opiate, Ur Screen: NOT DETECTED
Phencyclidine (PCP) Ur S: NOT DETECTED
Tricyclic, Ur Screen: NOT DETECTED

## 2020-06-26 LAB — CBC
HCT: 40.1 % (ref 39.0–52.0)
Hemoglobin: 14.4 g/dL (ref 13.0–17.0)
MCH: 31.7 pg (ref 26.0–34.0)
MCHC: 35.9 g/dL (ref 30.0–36.0)
MCV: 88.3 fL (ref 80.0–100.0)
Platelets: 297 10*3/uL (ref 150–400)
RBC: 4.54 MIL/uL (ref 4.22–5.81)
RDW: 11.9 % (ref 11.5–15.5)
WBC: 9.4 10*3/uL (ref 4.0–10.5)
nRBC: 0 % (ref 0.0–0.2)

## 2020-06-26 LAB — COMPREHENSIVE METABOLIC PANEL
ALT: 27 U/L (ref 0–44)
AST: 30 U/L (ref 15–41)
Albumin: 5 g/dL (ref 3.5–5.0)
Alkaline Phosphatase: 51 U/L (ref 38–126)
Anion gap: 11 (ref 5–15)
BUN: 12 mg/dL (ref 6–20)
CO2: 25 mmol/L (ref 22–32)
Calcium: 9.3 mg/dL (ref 8.9–10.3)
Chloride: 104 mmol/L (ref 98–111)
Creatinine, Ser: 1.21 mg/dL (ref 0.61–1.24)
GFR calc Af Amer: >60 mL/min (ref >60)
GFR calc non Af Amer: >60 mL/min (ref >60)
Glucose, Bld: 88 mg/dL (ref 70–99)
Potassium: 3.3 mmol/L — ABNORMAL LOW (ref 3.5–5.1)
Sodium: 140 mmol/L (ref 135–145)
Total Bilirubin: 2.1 mg/dL — ABNORMAL HIGH (ref 0.3–1.2)
Total Protein: 7.7 g/dL (ref 6.5–8.1)

## 2020-06-26 LAB — RESPIRATORY PANEL BY RT PCR (FLU A&B, COVID)
Influenza A by PCR: NEGATIVE
Influenza B by PCR: NEGATIVE
SARS Coronavirus 2 by RT PCR: NEGATIVE

## 2020-06-26 LAB — SALICYLATE LEVEL: Salicylate Lvl: <7 mg/dL — ABNORMAL LOW (ref 7.0–30.0)

## 2020-06-26 LAB — ACETAMINOPHEN LEVEL: Acetaminophen (Tylenol), Serum: <10 ug/mL — ABNORMAL LOW (ref 10–30)

## 2020-06-26 LAB — LITHIUM LEVEL: Lithium Lvl: 0.11 mmol/L — ABNORMAL LOW (ref 0.60–1.20)

## 2020-06-26 LAB — ETHANOL: Alcohol, Ethyl (B): <10 mg/dL (ref <10)

## 2020-06-26 MED ORDER — POTASSIUM CHLORIDE CRYS ER 20 MEQ PO TBCR
40.0000 meq | EXTENDED_RELEASE_TABLET | Freq: Once | ORAL | Status: AC
  Administered 2020-06-26: 40 meq via ORAL
  Filled 2020-06-26: qty 2

## 2020-06-26 NOTE — ED Triage Notes (Signed)
Pt presents via [employer] voluntary for depression. Reports he is sad for the things he has done in the past and not being able to see his children. Denies SI/HI at this time. Calm and cooperative in triage.

## 2020-06-26 NOTE — ED Triage Notes (Signed)
Collected:   $5 in black wallet.

## 2020-06-26 NOTE — ED Notes (Signed)
Hourly rounding reveals patient awake in hall bed. No complaints, stable, in no acute distress. Q15 minute rounds and monitoring via Rover and Officer to continue.  

## 2020-06-26 NOTE — ED Provider Notes (Signed)
[facility] Emergency Department Provider Note  ____________________________________________   First MD Initiated Contact with Patient 06/26/20 2007     (approximate)  I have reviewed the triage vital signs and the nursing notes.   HISTORY  Chief Complaint Suicidal   HPI Robert Holmes is a 39 y.o. male with a past medical history of bipolar disorder, depression, and panic attacks who presents for assessment of worsening depression and hallucinations.  Patient notes he has not slept in several days.  He states he is not taking any of his medications but taking an herbal supplement that he does not recall the name of.  When asked what he is hallucinating he states "I see windows working in windows working in windows".  He denies taking any illegal drugs.  He endorses a HI but not towards anyone in particular and will not answer when asked directly if he is feeling suicidal.  He denies any physical acute complaints including headache, earache, sore throat, chest pain, cough, shortness of breath abdominal pain, vomiting, diarrhea, dysuria, rash, recent traumatic injuries.  Denies any other acute concerns at this time but does request help for how he is feeling.         Past Medical History:  Diagnosis Date  . Bipolar disorder (HCC)   . Depression   . Panic attack     Patient Active Problem List   Diagnosis Date Noted  . GERD 08/06/2010  . PANCREATITIS 08/06/2010  . EPIGASTRIC PAIN 08/06/2010    Past Surgical History:  Procedure Laterality Date  . TONSILLECTOMY      Prior to Admission medications   Medication Sig Start Date End Date Taking? Authorizing Provider  lithium carbonate (LITHOBID) 300 MG CR tablet Take 2 tablets (600 mg total) by mouth at bedtime. 04/01/16   Patrick North, MD  sertraline (ZOLOFT) 100 MG tablet Take 1.5 tablets (150 mg total) by mouth daily. 04/01/16   Patrick North, MD    Allergies Patient has no known  allergies.  Family History  Problem Relation Age of Onset  . Anxiety disorder Mother   . Depression Mother   . Panic disorder Mother   . Hypertension Father   . Stroke Father   . Diabetes Paternal Grandfather     Social History Social History   Tobacco Use  . Smoking status: Former Smoker    Types: Cigarettes    Start date: 07/05/1996    Quit date: 09/23/2014    Years since quitting: 5.7  . Smokeless tobacco: Former User    Types: Snuff, Dorna Bloom    Quit date: 07/05/1998  Substance Use Topics  . Alcohol use: No    Alcohol/week: 0.0 standard drinks  . Drug use: No    Review of Systems  Review of Systems  Constitutional: Negative for chills and fever.  HENT: Negative for sore throat.   Eyes: Negative for pain.  Respiratory: Negative for cough and stridor.   Cardiovascular: Negative for chest pain.  Gastrointestinal: Negative for vomiting.  Genitourinary: Negative for dysuria.  Musculoskeletal: Negative for myalgias.  Skin: Negative for rash.  Neurological: Negative for seizures, loss of consciousness and headaches.  Psychiatric/Behavioral: Positive for depression and hallucinations. Negative for suicidal ideas. The patient is nervous/anxious and has insomnia.   All other systems reviewed and are negative.     ____________________________________________   PHYSICAL EXAM:  VITAL SIGNS: ED Triage Vitals  Enc Vitals Group     BP 06/26/20 1952 (!) 151/100  Pulse Rate 06/26/20 1952 87     Resp 06/26/20 1952 14     Temp 06/26/20 1952 98.6 F (37 C)     Temp Source 06/26/20 1952 Oral     SpO2 06/26/20 1952 100 %     Weight --      Height --      Head Circumference --      Peak Flow --      Pain Score 06/26/20 1951 0     Pain Loc --      Pain Edu? --      Excl. in [place]? --    Vitals:   06/26/20 1952  BP: (!) 151/100  Pulse: 87  Resp: 14  Temp: 98.6 F (37 C)  SpO2: 100%   Physical Exam Vitals and nursing note reviewed.  Constitutional:       Appearance: He is well-developed.  HENT:     Head: Normocephalic and atraumatic.     Right Ear: External ear normal.     Left Ear: External ear normal.     Nose: Nose normal.     Mouth/Throat:     Mouth: Mucous membranes are moist.  Eyes:     Conjunctiva/sclera: Conjunctivae normal.  Cardiovascular:     Rate and Rhythm: Normal rate and regular rhythm.     Heart sounds: No murmur heard.   Pulmonary:     Effort: Pulmonary effort is normal. No respiratory distress.     Breath sounds: Normal breath sounds.  Abdominal:     Palpations: Abdomen is soft.     Tenderness: There is no abdominal tenderness.  Musculoskeletal:     Cervical back: Neck supple.  Skin:    General: Skin is warm and dry.     Capillary Refill: Capillary refill takes less than 2 seconds.  Neurological:     Mental Status: He is alert.  Psychiatric:        Mood and Affect: Mood is depressed.        Speech: Speech is tangential.        Thought Content: Thought content does not include homicidal plan.      ____________________________________________   LABS (all labs ordered are listed, but only abnormal results are displayed)  Labs Reviewed  COMPREHENSIVE METABOLIC PANEL - Abnormal; Notable for the following components:      Result Value   Potassium 3.3 (*)    Total Bilirubin 2.1 (*)    All other components within normal limits  RESPIRATORY PANEL BY RT PCR (FLU A&B, COVID)  ETHANOL  CBC  SALICYLATE LEVEL  ACETAMINOPHEN LEVEL  URINE DRUG SCREEN, QUALITATIVE ([facility] ONLY)  URINALYSIS, COMPLETE ([facility]MP) WITH MICROSCOPIC  LITHIUM LEVEL   ____________________________________________ ____________________________________________   PROCEDURES  Procedure(s) performed (including Critical Care):  Procedures   ____________________________________________   INITIAL IMPRESSION / ASSESSMENT AND PLAN / ED COURSE        Patient presents with [place] to history exam requesting assistance for worsening  depression and hallucinations.  This is in the setting of patient not taking any medications prescribed for bipolar disorder but taking an unknown herbal supplement.  Patient is slightly hypertensive otherwise stable vital signs on arrival.  Exam as above.  Low suspicion for acute traumatic injury or acute infectious process.  Patient does not appear intoxicated although he does seem to be responding to some internal stimuli.  Routine psychiatric screening labs sent.  Psychiatry and TTS consulted.  IVC filled out by myself due to my concern patient  is a danger to himself at this time.  The patient has been placed in psychiatric observation due to the need to provide a safe environment for the patient while obtaining psychiatric consultation and evaluation, as well as ongoing medical and medication management to treat the patient's condition.  The patient has been placed under full IVC at this time.    ____________________________________________   FINAL CLINICAL IMPRESSION(S) / ED DIAGNOSES  Final diagnoses:  Depression, unspecified depression type  Hypokalemia    Medications - No data to display   ED Discharge Orders    None       Note:  This document was prepared using Dragon voice recognition software and may include unintentional dictation errors.   Gilles Chiquito, MD 06/26/20 2044

## 2020-06-26 NOTE — ED Notes (Signed)
Hourly rounding reveals patient awake in hallway bed. No complaints, stable, in no acute distress. Q15 minute rounds and monitoring via Rover and Officer to continue. 

## 2020-06-26 NOTE — ED Notes (Signed)
Pt given sandwich tray and water 

## 2020-06-26 NOTE — ED Triage Notes (Signed)
Reports bilateral leg pain from "exercising"

## 2020-06-26 NOTE — BH Assessment (Addendum)
10:17 PM Pt granted this Clinical research associate permission to contact his mother. Spoke with Bjorn Loser [phone] for collateral. Marylene Land reported pt has been "out of it" for the last couple of weeks. Marylene Land explained that the pt has not been taking his bipolar meds for an extended length of time; however, he needs them badly. Marylene Land explained that the pt has adverse side effects to various medications including but not limited to [place]. The mother explained that his last med regemin was effective but could not recall exactly what it was. Marylene Land explained that the pt has been showing up for work confused and just not himself. Marylene Land reported that the pt lives alone. Marylene Land expressed that the pt has not been eating or sleeping properly for the last couple of days. Marylene Land requested ongoing updates about the pt's plan of care.

## 2020-06-26 NOTE — BH Assessment (Addendum)
Assessment Note  Robert Holmes is an 39 y.o. male. Per triage note: Pt presents via BPD voluntary for depression. Reports he is sad for the things he has done in the past and not being able to see his children. Denies SI/HI at this time. Calm and cooperative in triage. Pt presented with an bizarre appearance and was seemingly disoriented. Pt spoke in a soft tone, low volume, and a normal pace. Motor behavior appears normal. Patient's speech is incoherent and irrelevant. Eye contact is fair. Pt's mood is depressed, affect is anxious/tearful. Thought processes were disorganized and not appropriate to the current circumstance/context. Pt responded with ruminations about being concerned about his children throughout the interview. The patient had limited insight and poor judgement. Pt reported symptoms of depression and anxiety and became visibly tearful. Pt reported that he is not connected to a psychiatrist or therapist. Pt lives alone and is employed. Pt denied any abuse hx. The patient denied SI, HI, AV/hallucinations or symptoms of paranoia.   Diagnosis: 96.53 F31.4 Bipolar I disorder, Current or most recent episode depressed, Severe  Past Medical History:  Past Medical History:  Diagnosis Date  . Bipolar disorder (HCC)   . Depression   . Panic attack     Past Surgical History:  Procedure Laterality Date  . TONSILLECTOMY      Family History:  Family History  Problem Relation Age of Onset  . Anxiety disorder Mother   . Depression Mother   . Panic disorder Mother   . Hypertension Father   . Stroke Father   . Diabetes Paternal Grandfather     Social History:  reports that he quit smoking about 5 years ago. His smoking use included cigarettes. He started smoking about 23 years ago. He quit smokeless tobacco use about 21 years ago.  His smokeless tobacco use included snuff and chew. He reports that he does not drink alcohol and does not use drugs.  Additional Social History:   Alcohol / Drug Use Pain Medications: See PTA Prescriptions: See PTA History of alcohol / drug use?: No history of alcohol / drug abuse  CIWA: CIWA-Ar BP: (!) 151/100 Pulse Rate: 87 COWS:    Allergies: No Known Allergies  Home Medications: (Not in a hospital admission)   OB/GYN Status:  No LMP for male patient.  General Assessment Data Location of Assessment: [facility] ED TTS Assessment: In system Is this a Tele or Face-to-Face Assessment?: Face-to-Face Is this an Initial Assessment or a Re-assessment for this encounter?: Initial Assessment Patient Accompanied by:: N/A Language Other than English: No Living Arrangements: Other (Comment) What gender do you identify as?: Male Date Telepsych consult ordered in [facility]: 06/26/20 Time Telepsych consult ordered in [facility]: 1954 Marital status: Single Maiden name: n/a Pregnancy Status: No Living Arrangements: Alone Can pt return to current living arrangement?: Yes Admission Status: Involuntary Petitioner: ED Attending Is patient capable of signing voluntary admission?: Yes Referral Source: Self/Family/Friend Insurance type: BCBS  Medical Screening Exam [facility] Walk-in ONLY) Medical Exam completed: Yes  Crisis Care Plan Living Arrangements: Alone Legal Guardian: Other: (Self) Name of Psychiatrist: None noted Name of Therapist: None noted  Education Status Is patient currently in school?: No Is the patient employed, unemployed or receiving disability?: Employed  Risk to self with the past 6 months Suicidal Ideation: No Has patient been a risk to self within the past 6 months prior to admission? : No Suicidal Intent: No Has patient had any suicidal intent within the past 6 months prior  to admission? : No Is patient at risk for suicide?: No Suicidal Plan?: No Has patient had any suicidal plan within the past 6 months prior to admission? : No Access to Means: No What has been your use of drugs/alcohol within the last 12 months?: None  noted Previous Attempts/Gestures: No How many times?: 0 Other Self Harm Risks: None noted Triggers for Past Attempts: None known Intentional Self Injurious Behavior: None Family Suicide History: Unable to assess Recent stressful life event(s): Conflict (Comment) Persecutory voices/beliefs?: No Depression: Yes Depression Symptoms: Tearfulness, Despondent, Insomnia Substance abuse history and/or treatment for substance abuse?: No Suicide prevention information given to non-admitted patients: Not applicable  Risk to Others within the past 6 months Homicidal Ideation: No Does patient have any lifetime risk of violence toward others beyond the six months prior to admission? : No Thoughts of Harm to Others: No Current Homicidal Intent: No Current Homicidal Plan: No Access to Homicidal Means: No Identified Victim: n/a History of harm to others?: No Assessment of Violence: None Noted Violent Behavior Description: n/a Does patient have access to weapons?: No Criminal Charges Pending?: No Does patient have a court date: No Is patient on probation?: No  Psychosis Hallucinations: Auditory, Visual Delusions: None noted  Mental Status Report Appearance/Hygiene: Bizarre Eye Contact: Fair Motor Activity: Freedom of movement Speech: Incoherent, Soft Level of Consciousness: Quiet/awake Mood: Anxious, Depressed Affect: Anxious, Depressed Anxiety Level: Moderate Thought Processes: Irrelevant Judgement: Impaired Orientation: Not oriented Obsessive Compulsive Thoughts/Behaviors: Minimal  Cognitive Functioning Concentration: Poor Memory: Unable to Assess Is patient IDD: No Insight: Fair Impulse Control: Poor Appetite: Poor Have you had any weight changes? : No Change Sleep: Decreased Total Hours of Sleep: 3 Vegetative Symptoms: Unable to Assess  ADLScreening [facility] Assessment Services) Patient's cognitive ability adequate to safely complete daily activities?: Yes Patient able to  express need for assistance with ADLs?: Yes Independently performs ADLs?: Yes (appropriate for developmental age)  Prior Inpatient Therapy Prior Inpatient Therapy: No  Prior Outpatient Therapy Prior Outpatient Therapy: No Does patient have an ACCT team?: No Does patient have Intensive In-House Services?  : No Does patient have [facility] services? : No Does patient have P4CC services?: No  ADL Screening (condition at time of admission) Patient's cognitive ability adequate to safely complete daily activities?: Yes Is the patient deaf or have difficulty hearing?: No Does the patient have difficulty seeing, even when wearing glasses/contacts?: No Does the patient have difficulty concentrating, remembering, or making decisions?: No Patient able to express need for assistance with ADLs?: Yes Does the patient have difficulty dressing or bathing?: No Independently performs ADLs?: Yes (appropriate for developmental age) Does the patient have difficulty walking or climbing stairs?: No Weakness of Legs: None Weakness of Arms/Hands: None  Home Assistive Devices/Equipment Home Assistive Devices/Equipment: None  Therapy Consults (therapy consults require a physician order) PT Evaluation Needed: No OT Evalulation Needed: No SLP Evaluation Needed: No Abuse/Neglect Assessment (Assessment to be complete while patient is alone) Abuse/Neglect Assessment Can Be Completed: Unable to assess, patient is non-responsive or altered mental status Values / Beliefs Cultural Requests During Hospitalization: None Spiritual Requests During Hospitalization: None Consults Spiritual Care Consult Needed: No Transition of Care Team Consult Needed: No Advance Directives (For Healthcare) Does Patient Have a Medical Advance Directive?: No          Disposition: Per Eddie, NP pt is recommended for overnight observation and reassessment in the AM.  Disposition Initial Assessment Completed for this Encounter:  Yes  On Site Evaluation by:  Reviewed with Physician:    Foy Guadalajara 06/26/2020 10:46 PM

## 2020-06-26 NOTE — ED Triage Notes (Signed)
Pt reports visual and auditory hallucinations while working. States "I could hear my son's prayers and see him through the window at work"

## 2020-06-26 NOTE — ED Notes (Signed)
Hourly rounding reveals patient awake in hallway. No complaints, stable, in no acute distress. Q15 minute rounds and monitoring via Psychologist, counselling to continue.

## 2020-06-26 NOTE — ED Notes (Signed)
Pt. Transferred from Triage to room 19H after dressing out and screening for contraband. Report to include Situation, Background, Assessment and Recommendations from [place], [place]. Pt. Oriented to [place] including Q15 minute rounds as well as Psychologist, counselling for their protection. Patient is alert and oriented, warm and dry in no acute distress. Patient denies SI, HI, and AVH. Pt. Encouraged to let this nurse know if needs arise.

## 2020-06-27 MED ORDER — TRIHEXYPHENIDYL HCL 2 MG PO TABS
1.0000 mg | ORAL_TABLET | Freq: Two times a day (BID) | ORAL | Status: DC
  Administered 2020-06-27: 1 mg via ORAL
  Filled 2020-06-27 (×2): qty 1

## 2020-06-27 MED ORDER — ARIPIPRAZOLE 5 MG PO TABS
5.0000 mg | ORAL_TABLET | Freq: Every day | ORAL | Status: DC
  Administered 2020-06-27: 5 mg via ORAL
  Filled 2020-06-27: qty 1

## 2020-06-27 MED ORDER — LITHIUM CARBONATE 300 MG PO CAPS
300.0000 mg | ORAL_CAPSULE | Freq: Two times a day (BID) | ORAL | Status: DC
  Administered 2020-06-27: 300 mg via ORAL
  Filled 2020-06-27: qty 1

## 2020-06-27 MED ORDER — DROPERIDOL 2.5 MG/ML IJ SOLN
5.0000 mg | Freq: Once | INTRAMUSCULAR | Status: AC
  Administered 2020-06-27: 5 mg via INTRAMUSCULAR
  Filled 2020-06-27: qty 2

## 2020-06-27 MED ORDER — DIPHENHYDRAMINE HCL 25 MG PO CAPS
50.0000 mg | ORAL_CAPSULE | Freq: Once | ORAL | Status: AC
  Administered 2020-06-27: 50 mg via ORAL
  Filled 2020-06-27: qty 2

## 2020-06-27 MED ORDER — SERTRALINE HCL 50 MG PO TABS
50.0000 mg | ORAL_TABLET | Freq: Every day | ORAL | Status: DC
  Administered 2020-06-27: 50 mg via ORAL
  Filled 2020-06-27: qty 1

## 2020-06-27 NOTE — ED Notes (Signed)
Pt. To BHU from ED ambulatory without difficulty, to room  BHU 1. Report from Chris RN. Pt. Is alert and oriented, warm and dry in no distress. Pt. Denies SI, HI, and AVH. Pt. Calm and cooperative. Pt. Made aware of security cameras and Q15 minute rounds. Pt. Encouraged to let Nursing staff know of any concerns or needs.  ? ?

## 2020-06-27 NOTE — ED Notes (Signed)
Patient out of room in dayroom. Writer advised patient dayroom is closed at this time. He was given cup of ice water and advised he needs to go to room. Patient followed instructions and went into room. Patient currently in room.

## 2020-06-27 NOTE — ED Notes (Signed)
Report given to [facility] provider. SOC currently in progress.

## 2020-06-27 NOTE — ED Notes (Signed)
Patient is actively hearing and seeing hallucinations. Patient thinking this Clinical research associate in a hallucinations. Patient screaming in unit. THis writer tried to redirect patient of behaviors.

## 2020-06-27 NOTE — ED Notes (Signed)
Pt given [place] sandwich tray and ice water; no other needs voiced at this time. Will continue to monitor Q15 minute rounds.

## 2020-06-27 NOTE — ED Notes (Signed)

## 2020-06-27 NOTE — ED Notes (Signed)
SOC complete.  

## 2020-06-27 NOTE — ED Notes (Signed)
Pt asks for meds to help with sleep at this time. Pt becomes tearful due to not being able to sleep. MD notified

## 2020-06-27 NOTE — ED Notes (Signed)
Patient on phone  

## 2020-06-27 NOTE — ED Notes (Signed)
Hourly rounding reveals patient in room. No complaints, stable, in no acute distress. Q15 minute rounds and monitoring via Security Cameras to continue. 

## 2020-06-27 NOTE — ED Notes (Signed)
Report to include Situation, Background, Assessment, and Recommendations received from Jeannette RN. Patient alert and oriented, warm and dry, in no acute distress. Patient denies SI, HI, AVH and pain. Patient made aware of Q15 minute rounds and security cameras for their safety. Patient instructed to come to me with needs or concerns.  

## 2020-06-27 NOTE — Progress Notes (Signed)
[facility] MD Progress Note  06/27/2020 3:32 PM Robert Holmes  MRN:  [id number] Subjective:   I want to go home  ON IVC  Principal Problem: <principal problem not specified> Diagnosis: Active Problems:   * No active hospital problems. *  Bipolar disorder with psychosis On IVC at risk of clinical deterioration if discharged   Patient ---continues on unit but minimizes issues  His mom is concerned about risk of clinical deterioration if discharged --as he does not take meds   He has bipolar traits and issues and remains dysfunctional  Awaits bed transfer Because he cannot stay safe he says  Has not taken meds several years with resultant ups and downs, erratic behaviors, lack of sleep --mood swings, ---impulsivity not doing well at work --- He is disturbing the neighbors and becomes loud and pressured  Very poor insight and does not see why he needs medications and has not previously considered depot injections  He says he previously does best with sertraline and lithium and ability         Total Time spent with patient:   30-40    Past Psychiatric History:  Previous -- outpatient admissions non compliance history   Not doing well at work  Has been erratic up and down, high and low labile has been strange and odd talkative and has trouble sleeping  Mom concerned about risk of deterioration if discharged    Past Medical History:  Past Medical History:  Diagnosis Date  . Bipolar disorder (HCC)   . Depression   . Panic attack     Past Surgical History:  Procedure Laterality Date  . TONSILLECTOMY     Family History:  Family History  Problem Relation Age of Onset  . Anxiety disorder Mother   . Depression Mother   . Panic disorder Mother   . Hypertension Father   . Stroke Father   . Diabetes Paternal Grandfather    Family Psychiatric  History:  Already noted  Social History:   Already  Noted  Social History   Substance and Sexual Activity  Alcohol Use No  .  Alcohol/week: 0.0 standard drinks     Social History   Substance and Sexual Activity  Drug Use No    Social History   Socioeconomic History  . Marital status: Married    Spouse name: Not on file  . Number of children: Not on file  . Years of education: Not on file  . Highest education level: Not on file  Occupational History  . Not on file  Tobacco Use  . Smoking status: Former Smoker    Types: Cigarettes    Start date: 07/05/1996    Quit date: 09/23/2014    Years since quitting: 5.7  . Smokeless tobacco: Former Neurosurgeon    Types: Snuff, Dorna Bloom    Quit date: 07/05/1998  Substance and Sexual Activity  . Alcohol use: No    Alcohol/week: 0.0 standard drinks  . Drug use: No  . Sexual activity: Not Currently  Other Topics Concern  . Not on file  Social History Narrative  . Not on file   Social Determinants of Health   Financial Resource Strain:   . Difficulty of Paying Living Expenses: Not on file  Food Insecurity:   . Worried About Programme researcher, broadcasting/film/video in the Last Year: Not on file  . Ran Out of Food in the Last Year: Not on file  Transportation Needs:   . Lack of Transportation (Medical):  Not on file  . Lack of Transportation (Non-Medical): Not on file  Physical Activity:   . Days of Exercise per Week: Not on file  . Minutes of Exercise per Session: Not on file  Stress:   . Feeling of Stress : Not on file  Social Connections:   . Frequency of Communication with Friends and Family: Not on file  . Frequency of Social Gatherings with Friends and Family: Not on file  . Attends Religious Services: Not on file  . Active Member of Clubs or Organizations: Not on file  . Attends Banker Meetings: Not on file  . Marital Status: Not on file   Additional Social History:    Pain Medications: See PTA Prescriptions: See PTA History of alcohol / drug use?: No history of alcohol / drug abuse      Patient remains in house pending bed transfer for risk of clinical  deterioration and need for inpatient stay on IVC   Mom concerned about deterioration and non compliance               Sleep:  Erratic and low Appetite:  Okay     Current Medications: No current facility-administered medications for this encounter.   Current Outpatient Medications  Medication Sig Dispense Refill  . lithium carbonate (LITHOBID) 300 MG CR tablet Take 2 tablets (600 mg total) by mouth at bedtime. 60 tablet 4  . sertraline (ZOLOFT) 100 MG tablet Take 1.5 tablets (150 mg total) by mouth daily. 45 tablet 4    Lab Results:  Results for orders placed or performed during the hospital encounter of 06/26/20 (from the past 48 hour(s))  Comprehensive metabolic panel     Status: Abnormal   Collection Time: 06/26/20  7:56 PM  Result Value Ref Range   Sodium 140 135 - 145 mmol/L   Potassium 3.3 (L) 3.5 - 5.1 mmol/L   Chloride 104 98 - 111 mmol/L   CO2 25 22 - 32 mmol/L   Glucose, Bld 88 70 - 99 mg/dL    Comment: Glucose reference range applies only to samples taken after fasting for at least 8 hours.   BUN 12 6 - 20 mg/dL   Creatinine, Ser 5.27 0.61 - 1.24 mg/dL   Calcium 9.3 8.9 - 78.2 mg/dL   Total Protein 7.7 6.5 - 8.1 g/dL   Albumin 5.0 3.5 - 5.0 g/dL   AST 30 15 - 41 U/L   ALT 27 0 - 44 U/L   Alkaline Phosphatase 51 38 - 126 U/L   Total Bilirubin 2.1 (H) 0.3 - 1.2 mg/dL   GFR calc non Af Amer >60 >60 mL/min   GFR calc Af Amer >60 >60 mL/min   Anion gap 11 5 - 15    Comment: Performed at [facility], [address] [place], [place] [address]  Ethanol     Status: None   Collection Time: 06/26/20  7:56 PM  Result Value Ref Range   Alcohol, Ethyl (B) <10 <10 mg/dL    Comment: (NOTE) Lowest detectable limit for serum alcohol is 10 mg/dL.  For medical purposes only. Performed at [facility], [address] [place], [place] [address]   Salicylate level     Status: Abnormal   Collection Time: 06/26/20  7:56 PM  Result Value Ref Range    Salicylate Lvl <7.0 (L) 7.0 - 30.0 mg/dL    Comment: Performed at [facility], [address] [place], [place] [address]  Acetaminophen  level     Status: Abnormal   Collection Time: 06/26/20  7:56 PM  Result Value Ref Range   Acetaminophen (Tylenol), Serum <10 (L) 10 - 30 ug/mL    Comment: (NOTE) Therapeutic concentrations vary significantly. A range of 10-30 ug/mL  may be an effective concentration for many patients. However, some  are best treated at concentrations outside of this range. Acetaminophen concentrations >150 ug/mL at 4 hours after ingestion  and >50 ug/mL at 12 hours after ingestion are often associated with  toxic reactions.  Performed at [facility], [address] [place], [place] [address]   cbc     Status: None   Collection Time: 06/26/20  7:56 PM  Result Value Ref Range   WBC 9.4 4.0 - 10.5 K/uL   RBC 4.54 4.22 - 5.81 MIL/uL   Hemoglobin 14.4 13.0 - 17.0 g/dL   HCT 53.9 39 - 52 %   MCV 88.3 80.0 - 100.0 fL   MCH 31.7 26.0 - 34.0 pg   MCHC 35.9 30.0 - 36.0 g/dL   RDW 76.7 34.1 - 93.7 %   Platelets 297 150 - 400 K/uL   nRBC 0.0 0.0 - 0.2 %    Comment: Performed at [facility], [address] [place], [place] [address]  Lithium level     Status: Abnormal   Collection Time: 06/26/20  7:56 PM  Result Value Ref Range   Lithium Lvl 0.11 (L) 0.60 - 1.20 mmol/L    Comment: Performed at [facility], [address], [place], [place] [address]  Urine Drug Screen, Qualitative     Status: None   Collection Time: 06/26/20  9:50 PM  Result Value Ref Range   Tricyclic, Ur Screen NONE DETECTED NONE DETECTED   Amphetamines, Ur Screen NONE DETECTED NONE DETECTED   MDMA (Ecstasy)Ur Screen NONE DETECTED NONE DETECTED   Cocaine Metabolite,Ur [place] NONE DETECTED NONE DETECTED   Opiate, Ur Screen NONE DETECTED NONE DETECTED   Phencyclidine (PCP) Ur S NONE DETECTED NONE DETECTED   Cannabinoid 50 Ng, Ur [place] NONE DETECTED NONE DETECTED    Barbiturates, Ur Screen NONE DETECTED NONE DETECTED   Benzodiazepine, Ur Scrn NONE DETECTED NONE DETECTED   Methadone Scn, Ur NONE DETECTED NONE DETECTED    Comment: (NOTE) Tricyclics + metabolites, urine    Cutoff 1000 ng/mL Amphetamines + metabolites, urine  Cutoff 1000 ng/mL MDMA (Ecstasy), urine              Cutoff 500 ng/mL Cocaine Metabolite, urine          Cutoff 300 ng/mL Opiate + metabolites, urine        Cutoff 300 ng/mL Phencyclidine (PCP), urine         Cutoff 25 ng/mL Cannabinoid, urine                 Cutoff 50 ng/mL Barbiturates + metabolites, urine  Cutoff 200 ng/mL Benzodiazepine, urine              Cutoff 200 ng/mL Methadone, urine                   Cutoff 300 ng/mL  The urine drug screen provides only a preliminary, unconfirmed analytical test result and should not be used for non-medical purposes. Clinical consideration and professional judgment should be applied to any positive drug screen result due to possible interfering substances. A more specific alternate chemical method must be used in order to obtain a confirmed analytical result.  Gas chromatography / mass spectrometry (GC/MS) is the preferred confirm atory method. Performed at [facility], [address] [place], [place] [address]   Urinalysis, Complete w Microscopic     Status: Abnormal   Collection Time: 06/26/20  9:50 PM  Result Value Ref Range   Color, Urine YELLOW (A) YELLOW   APPearance HAZY (A) CLEAR   Specific Gravity, Urine 1.030 1.005 - 1.030   pH 5.0 5.0 - 8.0   Glucose, UA NEGATIVE NEGATIVE mg/dL   Hgb urine dipstick NEGATIVE NEGATIVE   Bilirubin Urine NEGATIVE NEGATIVE   Ketones, ur 80 (A) NEGATIVE mg/dL   Protein, ur 30 (A) NEGATIVE mg/dL   Nitrite NEGATIVE NEGATIVE   Leukocytes,Ua NEGATIVE NEGATIVE   WBC, UA 0-5 0 - 5 WBC/hpf   Bacteria, UA NONE SEEN NONE SEEN   Squamous Epithelial / LPF NONE SEEN 0 - 5   Mucus PRESENT     Comment: Performed at [facility], [address], [place], [place] [address]  Respiratory Panel by RT PCR (Flu A&B, Covid) - Nasopharyngeal Swab     Status: None   Collection Time: 06/26/20  9:50 PM   Specimen: Nasopharyngeal Swab  Result Value Ref Range   SARS Coronavirus 2 by RT PCR NEGATIVE NEGATIVE    Comment: (NOTE) SARS-CoV-2 target nucleic acids are NOT DETECTED.  The SARS-CoV-2 RNA is generally detectable in upper respiratoy specimens during the acute phase of infection. The lowest concentration of SARS-CoV-2 viral copies this assay can detect is 131 copies/mL. A negative result does not preclude SARS-Cov-2 infection and should not be used as the sole basis for treatment or other patient management decisions. A negative result may occur with  improper specimen collection/handling, submission of specimen other than nasopharyngeal swab, presence of viral mutation(s) within the areas targeted by this assay, and inadequate number of viral copies (<131 copies/mL). A negative result must be combined with clinical observations, patient history, and epidemiological information. The expected result is Negative.  Fact Sheet for Patients:  https://www.moore.com/  Fact Sheet for Healthcare Providers:  https://www.young.biz/  This test is no t yet approved or cleared by the [place] FDA and  has been authorized for detection and/or diagnosis of SARS-CoV-2 by FDA under an Emergency Use Authorization (EUA). This EUA will remain  in effect (meaning this test can be used) for the duration of the COVID-19 declaration under Section 564(b)(1) of the Act, 21 U.S.C. section 360bbb-3(b)(1), unless the authorization is terminated or revoked sooner.     Influenza A by PCR NEGATIVE NEGATIVE   Influenza B by PCR NEGATIVE NEGATIVE    Comment: (NOTE) The Xpert Xpress SARS-CoV-2/FLU/RSV assay is intended as an aid in  the diagnosis of influenza from Nasopharyngeal swab specimens and   should not be used as a sole basis for treatment. Nasal washings and  aspirates are unacceptable for Xpert Xpress SARS-CoV-2/FLU/RSV  testing.  Fact Sheet for Patients: https://www.moore.com/  Fact Sheet for Healthcare Providers: https://www.young.biz/  This test is not yet approved or cleared by the [place] FDA and  has been authorized for detection and/or diagnosis of SARS-CoV-2 by  FDA under an Emergency Use Authorization (EUA). This EUA will remain  in effect (meaning this test can be used) for the duration of the  Covid-19 declaration under Section 564(b)(1) of the Act, 21  U.S.C. section 360bbb-3(b)(1), unless the authorization is  terminated or revoked. Performed at [facility], [address], [place], [place] [address]  Blood Alcohol level:  Lab Results  Component Value Date   ETH <10 06/26/2020    Metabolic Disorder Labs: Lab Results  Component Value Date   HGBA1C 5.4 08/09/2014   No results found for: PROLACTIN Lab Results  Component Value Date   CHOL 172 08/09/2014   TRIG 247 (H) 08/09/2014   HDL 27 (L) 08/09/2014   VLDL 49 (H) 08/09/2014   LDLCALC 96 08/09/2014    Physical Findings: AIMS:  , ,  ,  ,   none for now    CIWA:    COWS:     Musculoskeletal: Strength & Muscle Tone: now    Gait & Station: normal   Patient leans: na   Psychiatric Specialty Exam: Physical Exam  Review of Systems  Blood pressure (!) 151/100, pulse 87, temperature 98.6 F (37 C), temperature source Oral, resp. rate 14, SpO2 100 %.There is no height or weight on file to calculate BMI.    Mental Status  Patient is vague and rushed  MS limited He is not clouded or fluctuant 'Concentration and attention is fair  Speech normal  Mood and affect ---anxious  Rapport and eye contact fair Judgment insight reliability poor Intelligence fund of knowledge average He has some paranoia and fearfulness Thought  process and content --has some illogical disorganized thought Memory cannot assess currently   Unclear safety margin not clear if he would harm self if discharged No shakes tics and tremors Abstraction poor  Oriented times four   Leans not known   Handedness not known  Akathisia none Cognition impaired with stress Recall okay  Language Normal Assets caring mom  ADL's affected when manic Aims not done Sleep on and off                                                              Sleep erratic ADL's fair Assets not clear Cognition slow Aims not done Handed ness not known Recall fair  Akathisia none Language  Normal  Psychomotor variable     Treatment Plan Summary:   Patient remains on IVC p ending bed transfer   Had thoughts of wanting to harm self and is vague on plans  Issues of clinical deterioration ---if discharged      Roselind Messieramakrishna Alzina Golda, MD 06/27/2020, 3:32 PM

## 2020-06-27 NOTE — ED Notes (Addendum)
Hourly rounding reveals patient awake in hallway bed. No complaints, stable, in no acute distress. Q15 minute rounds and monitoring via Rover and Officer to continue. 

## 2020-06-27 NOTE — BH Assessment (Addendum)
Referral information for Psychiatric Hospitalization faxed to;   [facility] [phone]), Currently under review with Jonny Ruiz  . [facility] ([phone]---[phone]),  . [facility] [phone]),   . [facility] [phone] -or- [phone]),

## 2020-06-27 NOTE — BH Assessment (Signed)
Late entry- TTS completed reassessment. Pt presents alert, depressed, anxious and oriented x 3. Pt reports to feel ready to discharged and is currently requesting to receive his medications. Pt's responses to the majority of questions were vague and and unclear. Pt confirmed to feel depressed and identified the lack of contact with his kids and other things he has done in the past as the triggers to his current symptoms. Pt reports plans for his mom to pick up upon discharge and currently denies any SI/HI/AH/VH.   TTS and provider attempted to contact pt's mom Marylene Land [phone]) at the time of the reassessment and left a HIPPA compliant voicemail.   Pt disposition was finalized per night TTS conversation with mom, concerns expressed and pt current presentation.   Per Dr. Smith Robert pt meets criteria for INPT  TTS made contact with Marylene Land at 6:50pm and updated her on pt's current disposition. Marylene Land agreed and is aware of the steps moving forward towards INPT.

## 2020-06-28 NOTE — ED Notes (Signed)
Hourly rounding reveals patient in room. No complaints, stable, in no acute distress. Q15 minute rounds and monitoring via Security Cameras to continue. 

## 2020-06-28 NOTE — BH Assessment (Signed)
Per the request of [facility], writer faxed them a copy of patient's negative COVID results.

## 2020-06-28 NOTE — BH Assessment (Signed)
PATIENT BED AVAILABLE AFTER 8AM for 06/28/20  Patient has been accepted to [facility].  Patient assigned to [facility] Accepting physician is Dr. Shanda Bumps.  Call report to [phone].  Representative was Jonny Ruiz.   ER Staff is aware of it:  [facility] ER Secretary  Dr. Don Perking, ER MD  [facility] Patient's Nurse     Address: [address], [place] [address]

## 2021-03-10 ENCOUNTER — Ambulatory Visit (INDEPENDENT_AMBULATORY_CARE_PROVIDER_SITE_OTHER): Payer: No Typology Code available for payment source

## 2021-03-10 ENCOUNTER — Ambulatory Visit
Admission: EM | Admit: 2021-03-10 | Discharge: 2021-03-10 | Disposition: A | Discharge status: Home / Self Care | Payer: No Typology Code available for payment source | Attending: Family Medicine | Admitting: Family Medicine

## 2021-03-10 ENCOUNTER — Other Ambulatory Visit: Payer: Self-pay

## 2021-03-10 ENCOUNTER — Encounter: Payer: Self-pay | Admitting: Emergency Medicine

## 2021-03-10 DIAGNOSIS — H1031 Unspecified acute conjunctivitis, right eye: Secondary | ICD-10-CM | POA: Diagnosis not present

## 2021-03-10 DIAGNOSIS — R059 Cough, unspecified: Secondary | ICD-10-CM | POA: Insufficient documentation

## 2021-03-10 DIAGNOSIS — Z20822 Contact with and (suspected) exposure to covid-19: Secondary | ICD-10-CM | POA: Diagnosis not present

## 2021-03-10 DIAGNOSIS — R0981 Nasal congestion: Secondary | ICD-10-CM | POA: Diagnosis not present

## 2021-03-10 DIAGNOSIS — J209 Acute bronchitis, unspecified: Secondary | ICD-10-CM | POA: Insufficient documentation

## 2021-03-10 DIAGNOSIS — R519 Headache, unspecified: Secondary | ICD-10-CM | POA: Diagnosis not present

## 2021-03-10 MED ORDER — PROMETHAZINE-DM 6.25-15 MG/5ML PO SYRP: 5.0000 mL | ORAL_SOLUTION | Freq: Four times a day (QID) | ORAL | 0 refills | Status: DC | PRN

## 2021-03-10 MED ORDER — DOXYCYCLINE HYCLATE 100 MG PO CAPS: 100.0000 mg | ORAL_CAPSULE | Freq: Two times a day (BID) | ORAL | 0 refills | Status: DC

## 2021-03-10 MED ORDER — MOXIFLOXACIN HCL 0.5 % OP SOLN: 1.0000 [drp] | Freq: Three times a day (TID) | OPHTHALMIC | 0 refills | Status: AC

## 2021-03-10 NOTE — Discharge Instructions (Addendum)
Medication as prescribed.  Rest. Lots of fluids.  Take care  Dr. Trenton Verne  

## 2021-03-10 NOTE — ED Provider Notes (Addendum)
[facility]    CSN: [id number] Arrival date & time: 03/10/21  1426      History   Chief Complaint Chief Complaint  Patient presents with   Cough   Nasal Congestion   HPI  40 year old male presents with the above complaints.   Patient reports he has been sick for the past several days.  He reports congestion, cough, headache, runny nose.  Also reports right eye redness and purulent discharge.  He reports that he is currently experiencing headache which is 8/10 in severity.  He has tried numerous over-the-counter medications without relief.  States that the cough is quite severe as well.  No documented fever.  No reported sick contacts.  No other complaints.  Past Medical History:  Diagnosis Date   Bipolar disorder (HCC)    Depression    Panic attack     Patient Active Problem List   Diagnosis Date Noted   GERD 08/06/2010   PANCREATITIS 08/06/2010   EPIGASTRIC PAIN 08/06/2010    Past Surgical History:  Procedure Laterality Date   TONSILLECTOMY         Home Medications    Prior to Admission medications   Medication Sig Start Date End Date Taking? Authorizing Provider  doxycycline (VIBRAMYCIN) 100 MG capsule Take 1 capsule (100 mg total) by mouth 2 (two) times daily. 03/10/21  Yes Zhane Bluitt G, DO  moxifloxacin (VIGAMOX) 0.5 % ophthalmic solution Place 1 drop into the right eye 3 (three) times daily for 7 days. 03/10/21 03/17/21 Yes Oyinkansola Truax G, DO  promethazine-dextromethorphan (PROMETHAZINE-DM) 6.25-15 MG/5ML syrup Take 5 mLs by mouth 4 (four) times daily as needed for cough. 03/10/21  Yes Tommie Sams, DO    Family History Family History  Problem Relation Age of Onset   Anxiety disorder Mother    Depression Mother    Panic disorder Mother    Hypertension Father    Stroke Father    Diabetes Paternal Grandfather     Social History Social History   Tobacco Use   Smoking status: Former    Pack years: 0.00    Types: Cigarettes    Start  date: 07/05/1996    Quit date: 09/23/2014    Years since quitting: 6.4   Smokeless tobacco: Former    Types: Snuff, Dorna Bloom    Quit date: 07/05/1998  Vaping Use   Vaping Use: Never used  Substance Use Topics   Alcohol use: No    Alcohol/week: 0.0 standard drinks   Drug use: No     Allergies   Patient has no known allergies.   Review of Systems Review of Systems Per HPI  Physical Exam Triage Vital Signs ED Triage Vitals  Enc Vitals Group     BP 03/10/21 1442 130/80     Pulse Rate 03/10/21 1442 96     Resp 03/10/21 1442 16     Temp 03/10/21 1442 99.4 F (37.4 C)     Temp Source 03/10/21 1442 Oral     SpO2 03/10/21 1442 99 %     Weight 03/10/21 1438 238 lb (108 kg)     Height 03/10/21 1438 5\' 11"  (1.803 m)     Head Circumference --      Peak Flow --      Pain Score 03/10/21 1438 8     Pain Loc --      Pain Edu? --      Excl. in GC? --    Updated Vital Signs  BP 130/80 (BP Location: Left Arm)   Pulse 96   Temp 99.4 F (37.4 C) (Oral)   Resp 16   Ht 5\' 11"  (1.803 m)   Wt 108 kg   SpO2 99%   BMI 33.19 kg/m   Visual Acuity Right Eye Distance:   Left Eye Distance:   Bilateral Distance:    Right Eye Near:   Left Eye Near:    Bilateral Near:     Physical Exam Constitutional:      General: He is not in acute distress.    Appearance: He is ill-appearing.  HENT:     Head: Normocephalic and atraumatic.     Nose: Congestion present.  Eyes:     Comments: Right eye with conjunctival injection and purulent discharge.  Cardiovascular:     Rate and Rhythm: Normal rate and regular rhythm.     Heart sounds: No murmur heard. Pulmonary:     Effort: Pulmonary effort is normal.     Comments: Coarse breath sounds. Neurological:     Mental Status: He is alert.  Psychiatric:        Mood and Affect: Mood normal.        Behavior: Behavior normal.     UC Treatments / Results  Labs (all labs ordered are listed, but only abnormal results are displayed) Labs Reviewed   SARS CORONAVIRUS 2 (TAT 6-24 HRS)    EKG   Radiology DG Chest 2 View  Result Date: 03/10/2021 CLINICAL DATA:  Cough EXAM: CHEST - 2 VIEW COMPARISON:  09/24/2019 FINDINGS: The heart size and mediastinal contours are within normal limits. Both lungs are clear. The visualized skeletal structures are unremarkable. IMPRESSION: No active cardiopulmonary disease. Electronically Signed   By: 11/22/2019 M.D.   On: 03/10/2021 15:05    Procedures Procedures (including critical care time)  Medications Ordered in UC Medications - No data to display  Initial Impression / Assessment and Plan / UC Course  I have reviewed the triage vital signs and the nursing notes.  Pertinent labs & imaging results that were available during my care of the patient were reviewed by me and considered in my medical decision making (see chart for details).    40 year old male presents with bacterial bronchitis and conjunctivitis.  Chest x-ray was obtained and was independently reviewed by me.  Interpretation: No evidence of pneumonia.  Treating with Vigamox, doxycycline, Promethazine DM.  Final Clinical Impressions(s) / UC Diagnoses   Final diagnoses:  Acute bronchitis, unspecified organism  Acute bacterial conjunctivitis of right eye     Discharge Instructions      Medication as prescribed.  Rest.  Lots of fluids.  Take care  Dr. 46    ED Prescriptions     Medication Sig Dispense Auth. Provider   moxifloxacin (VIGAMOX) 0.5 % ophthalmic solution Place 1 drop into the right eye 3 (three) times daily for 7 days. 3 mL Beverlyann Broxterman G, DO   doxycycline (VIBRAMYCIN) 100 MG capsule Take 1 capsule (100 mg total) by mouth 2 (two) times daily. 20 capsule Deunte Bledsoe G, DO   promethazine-dextromethorphan (PROMETHAZINE-DM) 6.25-15 MG/5ML syrup Take 5 mLs by mouth 4 (four) times daily as needed for cough. 118 mL 11-25-1990, DO      PDMP not reviewed this encounter.    Tommie Sams, Tommie Sams 03/10/21  [phone]

## 2021-03-10 NOTE — ED Triage Notes (Signed)
Patient c/o cough, congestion, nasal congestion, headache that started 4 days ago.  Patient also reports watery and red eyes that started yesterday.  Patient denies fevers.

## 2021-03-11 LAB — SARS CORONAVIRUS 2 (TAT 6-24 HRS): SARS Coronavirus 2: NEGATIVE

## 2021-08-10 ENCOUNTER — Emergency Department
Admission: EM | Admit: 2021-08-10 | Discharge: 2021-08-11 | Disposition: A | Discharge status: Other Acute Inpatient Hospital | Payer: No Typology Code available for payment source | Attending: Emergency Medicine | Admitting: Emergency Medicine

## 2021-08-10 ENCOUNTER — Encounter: Payer: Self-pay | Admitting: Intensive Care

## 2021-08-10 ENCOUNTER — Other Ambulatory Visit: Payer: Self-pay

## 2021-08-10 DIAGNOSIS — F3164 Bipolar disorder, current episode mixed, severe, with psychotic features: Secondary | ICD-10-CM | POA: Diagnosis present

## 2021-08-10 DIAGNOSIS — Z20822 Contact with and (suspected) exposure to covid-19: Secondary | ICD-10-CM | POA: Diagnosis not present

## 2021-08-10 DIAGNOSIS — Z87891 Personal history of nicotine dependence: Secondary | ICD-10-CM | POA: Diagnosis not present

## 2021-08-10 DIAGNOSIS — F32A Depression, unspecified: Secondary | ICD-10-CM

## 2021-08-10 DIAGNOSIS — F22 Delusional disorders: Secondary | ICD-10-CM

## 2021-08-10 LAB — COMPREHENSIVE METABOLIC PANEL
ALT: 29 U/L (ref 0–44)
AST: 35 U/L (ref 15–41)
Albumin: 5.2 g/dL — ABNORMAL HIGH (ref 3.5–5.0)
Alkaline Phosphatase: 63 U/L (ref 38–126)
Anion gap: 9 (ref 5–15)
BUN: 17 mg/dL (ref 6–20)
CO2: 27 mmol/L (ref 22–32)
Calcium: 9.5 mg/dL (ref 8.9–10.3)
Chloride: 100 mmol/L (ref 98–111)
Creatinine, Ser: 1.14 mg/dL (ref 0.61–1.24)
GFR, Estimated: >60 mL/min (ref >60)
Glucose, Bld: 125 mg/dL — ABNORMAL HIGH (ref 70–99)
Potassium: 3.1 mmol/L — ABNORMAL LOW (ref 3.5–5.1)
Sodium: 136 mmol/L (ref 135–145)
Total Bilirubin: 1.8 mg/dL — ABNORMAL HIGH (ref 0.3–1.2)
Total Protein: 8.4 g/dL — ABNORMAL HIGH (ref 6.5–8.1)

## 2021-08-10 LAB — SALICYLATE LEVEL: Salicylate Lvl: <7 mg/dL — ABNORMAL LOW (ref 7.0–30.0)

## 2021-08-10 LAB — URINE DRUG SCREEN, QUALITATIVE (ARMC ONLY)
Amphetamines, Ur Screen: NOT DETECTED
Barbiturates, Ur Screen: NOT DETECTED
Benzodiazepine, Ur Scrn: NOT DETECTED
Cannabinoid 50 Ng, Ur ~~LOC~~: NOT DETECTED
Cocaine Metabolite,Ur ~~LOC~~: NOT DETECTED
MDMA (Ecstasy)Ur Screen: NOT DETECTED
Methadone Scn, Ur: NOT DETECTED
Opiate, Ur Screen: NOT DETECTED
Phencyclidine (PCP) Ur S: NOT DETECTED
Tricyclic, Ur Screen: NOT DETECTED

## 2021-08-10 LAB — CBC
HCT: 44.1 % (ref 39.0–52.0)
Hemoglobin: 14.9 g/dL (ref 13.0–17.0)
MCH: 30.9 pg (ref 26.0–34.0)
MCHC: 33.8 g/dL (ref 30.0–36.0)
MCV: 91.5 fL (ref 80.0–100.0)
Platelets: 331 10*3/uL (ref 150–400)
RBC: 4.82 MIL/uL (ref 4.22–5.81)
RDW: 11.9 % (ref 11.5–15.5)
WBC: 6.7 10*3/uL (ref 4.0–10.5)
nRBC: 0 % (ref 0.0–0.2)

## 2021-08-10 LAB — ACETAMINOPHEN LEVEL: Acetaminophen (Tylenol), Serum: <10 ug/mL — ABNORMAL LOW (ref 10–30)

## 2021-08-10 LAB — ETHANOL: Alcohol, Ethyl (B): <10 mg/dL (ref <10)

## 2021-08-10 LAB — RESP PANEL BY RT-PCR (FLU A&B, COVID) ARPGX2
Influenza A by PCR: NEGATIVE
Influenza B by PCR: NEGATIVE
SARS Coronavirus 2 by RT PCR: NEGATIVE

## 2021-08-10 MED ORDER — GABAPENTIN 100 MG PO CAPS
100.0000 mg | ORAL_CAPSULE | Freq: Three times a day (TID) | ORAL | Status: DC
  Administered 2021-08-10: 100 mg via ORAL
  Filled 2021-08-10 (×2): qty 1

## 2021-08-10 MED ORDER — POTASSIUM CHLORIDE CRYS ER 20 MEQ PO TBCR
10.0000 meq | EXTENDED_RELEASE_TABLET | Freq: Two times a day (BID) | ORAL | Status: DC
  Administered 2021-08-10: 10 meq via ORAL
  Filled 2021-08-10: qty 1

## 2021-08-10 MED ORDER — RISPERIDONE 1 MG PO TABS
1.0000 mg | ORAL_TABLET | Freq: Two times a day (BID) | ORAL | Status: DC
  Administered 2021-08-10: 1 mg via ORAL
  Filled 2021-08-10: qty 1

## 2021-08-10 MED ORDER — DIVALPROEX SODIUM 500 MG PO DR TAB
500.0000 mg | DELAYED_RELEASE_TABLET | Freq: Two times a day (BID) | ORAL | Status: DC
  Administered 2021-08-10: 500 mg via ORAL
  Filled 2021-08-10: qty 1

## 2021-08-10 MED ORDER — TRAZODONE HCL 100 MG PO TABS: 50.0000 mg | ORAL_TABLET | Freq: Every evening | ORAL | Status: DC | PRN

## 2021-08-10 NOTE — ED Triage Notes (Addendum)
Denies SI/HI. [facility] sent patient here by police escort voluntary.Patient reports he feels like other people are going to harm him. Reports over 5-6 years since doing drugs. Patient very paranoid in triage and talks about random subjects. Reports a year ago [facility] put him on psych meds that were to strong and he has not been seen by anyone since being seen here. He also reports he was told to take benadryl everyday and it makes him sleepy

## 2021-08-10 NOTE — Consult Note (Signed)
[facility] Face-to-Face Psychiatry Consult   Reason for Consult:  paranoia  Referring Physician:  EDP Patient Identification: Robert Holmes MRN:  [id number] Principal Diagnosis: Bipolar affective disorder, mixed, severe, with psychotic behavior ([place]) Diagnosis:  Principal Problem:   Bipolar affective disorder, mixed, severe, with psychotic behavior ([place])   Total Time spent with patient: 1 hour  Subjective:   Robert Holmes is a 40 y.o. male patient admitted with paranoia.  Marland Kitchen  HPI:  40 yo male with paranoia, history of bipolar disorder.  He presents from [place] with paranoia that people are after him.  He rambles on assessment saying he broke into his mother's house even though she gave him the code to the security and flushed cretin down the toilet.  "I wasn't supposed to be there so I called the police (on himself)", no charges placed.  He reports using substances years ago, none recently.  Robert Holmes stopped taking his medications as they made him "too sleepy".  "The voices are coming back," not command type.  No suicidal ideations or homicidal ideations.  Inpatient hospitalization needed for stabilization.  Past Psychiatric History: bipolar d/o  Risk to Self:  yes Risk to Others:  none Prior Inpatient Therapy:  yes Prior Outpatient Therapy: [facility]   Past Medical History:  Past Medical History:  Diagnosis Date   Bipolar disorder ([place])    Depression    Panic attack     Past Surgical History:  Procedure Laterality Date   TONSILLECTOMY     Family History:  Family History  Problem Relation Age of Onset   Anxiety disorder Mother    Depression Mother    Panic disorder Mother    Hypertension Father    Stroke Father    Diabetes Paternal Grandfather    Family Psychiatric  History: see above Social History:  Social History   Substance and Sexual Activity  Alcohol Use No   Alcohol/week: 0.0 standard drinks     Social History   Substance and Sexual Activity  Drug Use No     Social History   Socioeconomic History   Marital status: Married    Spouse name: Not on file   Number of children: Not on file   Years of education: Not on file   Highest education level: Not on file  Occupational History   Not on file  Tobacco Use   Smoking status: Former    Types: Cigarettes    Start date: 07/05/1996    Quit date: 09/23/2014    Years since quitting: 6.8   Smokeless tobacco: Former    Types: Snuff, Sarina Ser    Quit date: 07/05/1998  Vaping Use   Vaping Use: Never used  Substance and Sexual Activity   Alcohol use: No    Alcohol/week: 0.0 standard drinks   Drug use: No   Sexual activity: Not Currently  Other Topics Concern   Not on file  Social History Narrative   Not on file   Social Determinants of Health   Financial Resource Strain: Not on file  Food Insecurity: Not on file  Transportation Needs: Not on file  Physical Activity: Not on file  Stress: Not on file  Social Connections: Not on file   Additional Social History:    Allergies:  No Known Allergies  Labs:  Results for orders placed or performed during the hospital encounter of 08/10/21 (from the past 48 hour(s))  Comprehensive metabolic panel     Status: Abnormal   Collection Time: 08/10/21  2:58 PM  Result Value Ref Range   Sodium 136 135 - 145 mmol/L   Potassium 3.1 (L) 3.5 - 5.1 mmol/L   Chloride 100 98 - 111 mmol/L   CO2 27 22 - 32 mmol/L   Glucose, Bld 125 (H) 70 - 99 mg/dL    Comment: Glucose reference range applies only to samples taken after fasting for at least 8 hours.   BUN 17 6 - 20 mg/dL   Creatinine, Ser 1.14 0.61 - 1.24 mg/dL   Calcium 9.5 8.9 - 10.3 mg/dL   Total Protein 8.4 (H) 6.5 - 8.1 g/dL   Albumin 5.2 (H) 3.5 - 5.0 g/dL   AST 35 15 - 41 U/L   ALT 29 0 - 44 U/L   Alkaline Phosphatase 63 38 - 126 U/L   Total Bilirubin 1.8 (H) 0.3 - 1.2 mg/dL   GFR, Estimated >60 >60 mL/min    Comment: (NOTE) Calculated using the CKD-EPI Creatinine Equation (2021)    Anion  gap 9 5 - 15    Comment: Performed at [facility], [address] [place], [place] [address]  Ethanol     Status: None   Collection Time: 08/10/21  2:58 PM  Result Value Ref Range   Alcohol, Ethyl (B) <10 <10 mg/dL    Comment: (NOTE) Lowest detectable limit for serum alcohol is 10 mg/dL.  For medical purposes only. Performed at [facility], [address] [place], [place] [id number]   Salicylate level     Status: Abnormal   Collection Time: 08/10/21  2:58 PM  Result Value Ref Range   Salicylate Lvl Q000111Q (L) 7.0 - 30.0 mg/dL    Comment: Performed at [facility], [address] [place], [place] [address]  Acetaminophen level     Status: Abnormal   Collection Time: 08/10/21  2:58 PM  Result Value Ref Range   Acetaminophen (Tylenol), Serum <10 (L) 10 - 30 ug/mL    Comment: (NOTE) Therapeutic concentrations vary significantly. A range of 10-30 ug/mL  may be an effective concentration for many patients. However, some  are best treated at concentrations outside of this range. Acetaminophen concentrations >150 ug/mL at 4 hours after ingestion  and >50 ug/mL at 12 hours after ingestion are often associated with  toxic reactions.  Performed at [facility], [address] [place], [place] [address]   cbc     Status: None   Collection Time: 08/10/21  2:58 PM  Result Value Ref Range   WBC 6.7 4.0 - 10.5 K/uL   RBC 4.82 4.22 - 5.81 MIL/uL   Hemoglobin 14.9 13.0 - 17.0 g/dL   HCT 44.1 39.0 - 52.0 %   MCV 91.5 80.0 - 100.0 fL   MCH 30.9 26.0 - 34.0 pg   MCHC 33.8 30.0 - 36.0 g/dL   RDW 11.9 11.5 - 15.5 %   Platelets 331 150 - 400 K/uL   nRBC 0.0 0.0 - 0.2 %    Comment: Performed at [facility], [address] [place], [place] [address]    No current facility-administered medications for this encounter.   Current Outpatient Medications  Medication Sig Dispense Refill   doxycycline (VIBRAMYCIN) 100 MG capsule Take 1 capsule  (100 mg total) by mouth 2 (two) times daily. 20 capsule 0   promethazine-dextromethorphan (PROMETHAZINE-DM) 6.25-15 MG/5ML syrup Take 5 mLs by mouth 4 (four) times daily as needed for cough. 118 mL 0    Musculoskeletal: Strength & Muscle Tone:  decreased Gait & Station: normal Patient leans: N/A  Psychiatric Specialty Exam: Physical Exam Vitals and nursing note reviewed.  Constitutional:      Appearance: Normal appearance.  HENT:     Head: Normocephalic.     Nose: Nose normal.  Pulmonary:     Effort: Pulmonary effort is normal.  Musculoskeletal:        General: Normal range of motion.     Cervical back: Normal range of motion.  Neurological:     General: No focal deficit present.     Mental Status: He is alert and oriented to person, place, and time.  Psychiatric:        Attention and Perception: Attention normal. He perceives auditory hallucinations.        Mood and Affect: Mood is anxious and depressed. Affect is blunt.        Speech: Speech normal.        Behavior: Behavior normal. Behavior is cooperative.        Thought Content: Thought content is paranoid and delusional.        Cognition and Memory: Cognition and memory normal.        Judgment: Judgment is inappropriate.    Review of Systems  Constitutional:  Positive for fatigue.  Psychiatric/Behavioral:  Positive for hallucinations. The patient is nervous/anxious.   All other systems reviewed and are negative.  Blood pressure (!) 150/98, pulse 96, temperature 98.4 F (36.9 C), temperature source Oral, resp. rate 18, height 5\' 11"  (1.803 m), weight 90.7 kg, SpO2 98 %.Body mass index is 27.89 kg/m.  General Appearance: Disheveled  Eye Contact:  Fair  Speech:  Normal Rate  Volume:  Normal  Mood:  Anxious  Affect:  Blunt  Thought Process:  Descriptions of Associations: Tangential  Orientation:  Full (Time, Place, and Person)  Thought Content:  Delusions, Hallucinations: Auditory, and Paranoid Ideation  Suicidal  Thoughts:  No  Homicidal Thoughts:  No  Memory:  Immediate;   Fair Recent;   Fair Remote;   Fair  Judgement:  Poor  Insight:  Fair  Psychomotor Activity:  Decreased  Concentration:  Concentration: Fair and Attention Span: Fair  Recall:  [employer] of Knowledge:  Fair  Language:  Good  Akathisia:  No  Handed:  Right  AIMS (if indicated):     Assets:  Housing Leisure Time Physical Health Resilience Social Support  ADL's:  Intact  Cognition:  WNL  Sleep:        Physical Exam: Physical Exam Vitals and nursing note reviewed.  Constitutional:      Appearance: Normal appearance.  HENT:     Head: Normocephalic.     Nose: Nose normal.  Pulmonary:     Effort: Pulmonary effort is normal.  Musculoskeletal:        General: Normal range of motion.     Cervical back: Normal range of motion.  Neurological:     General: No focal deficit present.     Mental Status: He is alert and oriented to person, place, and time.  Psychiatric:        Attention and Perception: Attention normal. He perceives auditory hallucinations.        Mood and Affect: Mood is anxious and depressed. Affect is blunt.        Speech: Speech normal.        Behavior: Behavior normal. Behavior is cooperative.        Thought Content: Thought content is paranoid and delusional.  Cognition and Memory: Cognition and memory normal.        Judgment: Judgment is inappropriate.   Review of Systems  Constitutional:  Positive for fatigue.  Psychiatric/Behavioral:  Positive for hallucinations. The patient is nervous/anxious.   All other systems reviewed and are negative. Blood pressure (!) 150/98, pulse 96, temperature 98.4 F (36.9 C), temperature source Oral, resp. rate 18, height 5\' 11"  (1.803 m), weight 90.7 kg, SpO2 98 %. Body mass index is 27.89 kg/m.  Treatment Plan Summary: Daily contact with patient to assess and evaluate symptoms and progress in treatment, Medication management, and Plan : Bipolar  affective disorder, mixed, severe with psychosis: -Started Depakote 500 mg BId -Started Risperdal 1 mg BID  Anxiety: -Started gabapentin 100 mg TID  Insomnia: -Started Trazodone 50 mg at bedtime PRN  Disposition: Recommend psychiatric Inpatient admission when medically cleared.  , NP 08/10/2021 5:21 PM

## 2021-08-10 NOTE — ED Notes (Signed)
Pt did not wish to take gabapentin. Pt stated he was not familiar with that medication.  RN explained it is sometimes used to help with anxiety.  Pt still refuses at this time.

## 2021-08-10 NOTE — ED Notes (Signed)
IVC PENDING  CONSULT ?

## 2021-08-10 NOTE — ED Notes (Signed)
Snack and beverage given. 

## 2021-08-10 NOTE — ED Notes (Signed)
Patient given meal tray. Patient stated he was unsure if he wanted the meal tray but it was left at his bedside. Prefers to drink water.

## 2021-08-10 NOTE — ED Provider Notes (Signed)
[facility] Emergency Department Provider Note  Time seen: 4:06 PM  I have reviewed the triage vital signs and the nursing notes.   HISTORY  Chief Complaint Mental Health Problem   HPI Robert Holmes is a 40 y.o. male with a past medical history of bipolar, depression, presents to the emergency department referred from [facility] for psychiatric evaluation.  According to report patient was seen [facility], admits he has not been taking any of his medications, patient appears to be confused with disorganized thought process, somewhat paranoid so they sent him to the ER for evaluation.  Here more the same patient is paranoid, states he thinks he was breaking and entering into his mom's house today and called police.  Not sure exactly what this statement means.  Patient appears to be confused and paranoid.  Patient has a quite disorganized thought process.  He has no medical complaints today.  States he has prescribed psychiatric medications but has not been taking them per patient.  Denies SI or HI.  Does have quite a flat affect throughout evaluation.   Past Medical History:  Diagnosis Date   Bipolar disorder ([facility])    Depression    Panic attack     Patient Active Problem List   Diagnosis Date Noted   GERD 08/06/2010   PANCREATITIS 08/06/2010   EPIGASTRIC PAIN 08/06/2010    Past Surgical History:  Procedure Laterality Date   TONSILLECTOMY      Prior to Admission medications   Medication Sig Start Date End Date Taking? Authorizing Provider  doxycycline (VIBRAMYCIN) 100 MG capsule Take 1 capsule (100 mg total) by mouth 2 (two) times daily. 03/10/21   Tommie Sams, DO  promethazine-dextromethorphan (PROMETHAZINE-DM) 6.25-15 MG/5ML syrup Take 5 mLs by mouth 4 (four) times daily as needed for cough. 03/10/21   Tommie Sams, DO    No Known Allergies  Family History  Problem Relation Age of Onset   Anxiety disorder Mother    Depression Mother    Panic disorder  Mother    Hypertension Father    Stroke Father    Diabetes Paternal Grandfather     Social History Social History   Tobacco Use   Smoking status: Former    Types: Cigarettes    Start date: 07/05/1996    Quit date: 09/23/2014    Years since quitting: 6.8   Smokeless tobacco: Former    Types: Snuff, Dorna Bloom    Quit date: 07/05/1998  Vaping Use   Vaping Use: Never used  Substance Use Topics   Alcohol use: No    Alcohol/week: 0.0 standard drinks   Drug use: No    Review of Systems Constitutional: Negative for fever. Cardiovascular: Negative for chest pain. Respiratory: Negative for shortness of breath. Gastrointestinal: Negative for abdominal pain, vomiting Genitourinary: Negative for urinary compaints Musculoskeletal: Negative for musculoskeletal complaints Neurological: Negative for headache All other ROS negative  ____________________________________________   PHYSICAL EXAM:  VITAL SIGNS: ED Triage Vitals  Enc Vitals Group     BP 08/10/21 1452 (!) 150/98     Pulse Rate 08/10/21 1452 96     Resp 08/10/21 1452 18     Temp 08/10/21 1452 98.4 F (36.9 C)     Temp Source 08/10/21 1452 Oral     SpO2 08/10/21 1452 98 %     Weight 08/10/21 1453 200 lb (90.7 kg)     Height 08/10/21 1453 5\' 11"  (1.803 m)     Head Circumference --  Peak Flow --      Pain Score 08/10/21 1453 0     Pain Loc --      Pain Edu? --      Excl. in GC? --    Constitutional: Alert and oriented.  Appears confused at times, somewhat disorganized, flat affect. Eyes: Normal exam ENT      Head: Normocephalic and atraumatic.      Mouth/Throat: Mucous membranes are moist. Cardiovascular: Normal rate, regular rhythm. Respiratory: Normal respiratory effort without tachypnea nor retractions. Breath sounds are clear  Gastrointestinal: Soft and nontender. No distention.   Musculoskeletal: Nontender with normal range of motion in all extremities.  Neurologic:  Normal speech and language. No gross  focal neurologic deficits Skin:  Skin is warm, dry and intact.  Psychiatric: Mood and affect are normal.   ____________________________________________   INITIAL IMPRESSION / ASSESSMENT AND PLAN / ED COURSE  Pertinent labs & imaging results that were available during my care of the patient were reviewed by me and considered in my medical decision making (see chart for details).   Patient presents emergency department for psychiatric evaluation sent from [facility].  Here the patient appears somewhat confused, unable to give a good history, disorganized thought process, flat affect.  Admits he has not been taking his psychiatric medication.  I believe the patient needs to be evaluated by psychiatry, do not believe the patient is safe for discharge home in his current state until he has seen psychiatry.  Given his confusion disorganized thoughts and paranoia we will place the patient under an IVC until psychiatry can evaluate.  Patient agreeable.  Psychiatry will likely admit.  Potassium 3.1 will replete for 10 doses.  Robert Holmes was evaluated in Emergency Department on 08/10/2021 for the symptoms described in the history of present illness. He was evaluated in the context of the global COVID-19 pandemic, which necessitated consideration that the patient might be at risk for infection with the SARS-CoV-2 virus that causes COVID-19. Institutional protocols and algorithms that pertain to the evaluation of patients at risk for COVID-19 are in a state of rapid change based on information released by regulatory bodies including the CDC and federal and state organizations. These policies and algorithms were followed during the patient's care in the ED.  ____________________________________________   FINAL CLINICAL IMPRESSION(S) / ED DIAGNOSES  Paranoia Depression   Minna Antis, MD 08/10/21 2004

## 2021-08-10 NOTE — BH Assessment (Signed)
Comprehensive Clinical Assessment (CCA) Note  08/10/2021 Robert Holmes Holmes [id number]-[id number]  Chief Complaint:  Chief Complaint  Patient presents with   Mental Health Problem   Visit Diagnosis: Bipolar  Robert Holmes is a 40 year old male who presents to the ER after he called 911 on his self, due to paranoia and delusional thinking. He reports of having broken in to her mother's home and was feeling bad about it. He further explained, he used the passcode, his mother gave him, to get in the house while she wasn't there. He equates it to breaking into the home. While in there, he flushed the meal supplement he had gave her, to help gain weight. He stated, he had thoughts that something bad was going to happen and that is why he flushed it. He also shared that he was grateful law enforcement didn't give him charges, even though his mother and police told him he didn't break in. He reports of having had medications for his mental illness approximately a year ago. It was when he was inpatient with [facility].  During the interview, the patient was anxious, fearful and attempted to be cooperative. Several times he became tearful and had to be redirected. He was fixated on the belief something bad was going to happen to him and he was a bad person. Each answer eventually led to him saying that he was bad and him believing something bad was going to happen.  He denies current use of any mind-altering substances. He states he has used drugs and alcohol in the past, and it was several years ago. His labs reflect the same.   CCA Screening, Triage and Referral (STR)  Patient Reported Information How did you hear about [place]? Other (Comment) ([facility])  What Is the Reason for Your Visit/Call Today? Patient is paranoid and fearful something is going to happen to him.  How Long Has This Been Causing You Problems? 1 wk - 1 month  What Do You Feel Would Help You the Most  Today? Treatment for Depression or other mood problem   Have You Recently Had Any Thoughts About Hurting Yourself? No  Are You Planning to Commit Suicide/Harm Yourself At This time? No   Have you Recently Had Thoughts About [place]? No  Are You Planning to Harm Someone at This Time? No  Explanation: No data recorded  Have You Used Any Alcohol or Drugs in the Past 24 Hours? No  How Long Ago Did You Use Drugs or Alcohol? No data recorded What Did You Use and How Much? No data recorded  Do You Currently Have a Therapist/Psychiatrist? No  Name of Therapist/Psychiatrist: No data recorded  Have You Been Recently Discharged From Any Office Practice or Programs? No  Explanation of Discharge From Practice/Program: No data recorded    CCA Screening Triage Referral Assessment Type of Contact: Face-to-Face  Telemedicine Service Delivery:   Is this Initial or Reassessment? No data recorded Date Telepsych consult ordered in [facility]:  No data recorded Time Telepsych consult ordered in [facility]:  No data recorded Location of Assessment: [facility] ED  Provider Location: [facility] ED   Collateral Involvement: No data recorded  Does Patient Have a [place]? No data recorded Name and Contact of Legal Guardian: No data recorded If Minor and Not Living with Parent(s), Who has Custody? No data recorded Is CPS involved or ever been involved? Never  Is APS involved or ever been involved? Never  Patient Determined To Be At Risk for Harm To Self or Others Based on Review of Patient Reported Information or Presenting Complaint? No  Method: No data recorded Availability of Means: No data recorded Intent: No data recorded Notification Required: No data recorded Additional Information for Danger to Others Potential: No data recorded Additional Comments for Danger to Others Potential: No data recorded Are There Guns or Other Weapons in Your Home? No data recorded Types of  Guns/Weapons: No data recorded Are These Weapons Safely Secured?                            No data recorded Who Could Verify You Are Able To Have These Secured: No data recorded Do You Have any Outstanding Charges, Pending Court Dates, Parole/Probation? No data recorded Contacted To Inform of Risk of Harm To Self or Others: No data recorded   Does Patient Present under Involuntary Commitment? Yes  IVC Papers Initial File Date: 08/10/21   [place] of Residence: [place]   Patient Currently Receiving the Following Services: Not Receiving Services   Determination of Need: Emergent (2 hours)   Options For Referral: Inpatient Hospitalization     CCA Biopsychosocial Patient Reported Schizophrenia/Schizoaffective Diagnosis in Past: No   Strengths: Have no desire or thoughts to hurt anyone, have a support system.   Mental Health Symptoms Depression:   Change in energy/activity; Hopelessness; Increase/decrease in appetite; Sleep (too much or little); Tearfulness; Worthlessness   Duration of Depressive symptoms:  Duration of Depressive Symptoms: Less than two weeks   Mania:   Racing thoughts   Anxiety:    Restlessness; Sleep; Tension; Worrying; Difficulty concentrating   Psychosis:   Delusions   Duration of Psychotic symptoms:  Duration of Psychotic Symptoms: Less than six months   Trauma:   N/A   Obsessions:   Cause anxiety; Disrupts routine/functioning; Intrusive/time consuming; Poor insight   Compulsions:   N/A   Inattention:   N/A   Hyperactivity/Impulsivity:   N/A   Oppositional/Defiant Behaviors:   N/A   Emotional Irregularity:   N/A   Other Mood/Personality Symptoms:  No data recorded   Mental Status Exam Appearance and self-care  Stature:   Average   Weight:   Average weight   Clothing:   Age-appropriate   Grooming:   Normal   Cosmetic use:   None   Posture/gait:   Normal   Motor activity:   -- (Within normal range)    Sensorium  Attention:   Normal   Concentration:   Focuses on irrelevancies; Scattered   Orientation:   X5   Recall/memory:   Normal   Affect and Mood  Affect:   Appropriate; Full Range   Mood:   Anxious; Depressed   Relating  Eye contact:   Fleeting   Facial expression:   Constricted; Sad; Tense; Anxious   Attitude toward examiner:   Cooperative   Thought and Language  Speech flow:  Clear and Coherent; Normal   Thought content:   Appropriate to Mood and Circumstances   Preoccupation:   Obsessions; Guilt   Hallucinations:   None   Organization:  No data recorded  [employer] of Knowledge:   Fair   Intelligence:   Average   Abstraction:   Functional   Judgement:   Impaired   Reality Testing:   Distorted   Insight:   Poor   Decision Making:   Paralyzed   Social Functioning  Social Maturity:  Isolates   Social Judgement:   Heedless   Stress  Stressors:   Other (Comment)   Coping Ability:   Overwhelmed   Skill Deficits:   None   Supports:   Family     Religion: Religion/Spirituality Are You A Religious Person?: No  Leisure/Recreation: Leisure / Recreation Do You Have Hobbies?: No  Exercise/Diet: Exercise/Diet Do You Exercise?: No Have You Gained or Lost A Significant Amount of Weight in the Past Six Months?: No Do You Follow a Special Diet?: No Do You Have Any Trouble Sleeping?: No   CCA Employment/Education Employment/Work Situation: Employment / Work Situation Employment Situation: Unemployed Has Patient ever Been in Passenger transport manager?: No  Education: Education Is Patient Currently Attending School?: No Did You Have An Individualized Education Program (IIEP): No Did You Have Any Difficulty At [employer]?: No Patient's Education Has Been Impacted by Current Illness: No   CCA Family/Childhood History Family and Relationship History: Family history Marital status: Divorced Divorced, when?:  UTA What types of issues is patient dealing with in the relationship?: UTA Additional relationship information: UTA Does patient have children?: Yes How is patient's relationship with their children?: UTA  Childhood History:  Childhood History By whom was/is the patient raised?: Mother Did patient suffer any verbal/emotional/physical/sexual abuse as a child?: No Did patient suffer from severe childhood neglect?: No Has patient ever been sexually abused/assaulted/raped as an adolescent or adult?: No Was the patient ever a victim of a crime or a disaster?: No Witnessed domestic violence?: No Has patient been affected by domestic violence as an adult?: No  Child/Adolescent Assessment:     CCA Substance Use Alcohol/Drug Use: Alcohol / Drug Use Pain Medications: See PTA Prescriptions: See PTA Over the Counter: See PTA History of alcohol / drug use?: Yes Longest period of sobriety (when/how long): "Some Years"    ASAM's:  Six Dimensions of Multidimensional Assessment  Dimension 1:  Acute Intoxication and/or Withdrawal Potential:      Dimension 2:  Biomedical Conditions and Complications:      Dimension 3:  Emotional, Behavioral, or Cognitive Conditions and Complications:     Dimension 4:  Readiness to Change:     Dimension 5:  Relapse, Continued use, or Continued Problem Potential:     Dimension 6:  Recovery/Living Environment:     ASAM Severity Score:    ASAM Recommended Level of Treatment:     Substance use Disorder (SUD)    Recommendations for Services/Supports/Treatments:    Discharge Disposition:    DSM5 Diagnoses: Patient Active Problem List   Diagnosis Date Noted   Bipolar affective disorder, mixed, severe, with psychotic behavior ([place]) 08/10/2021   GERD 08/06/2010   PANCREATITIS 08/06/2010   EPIGASTRIC PAIN 08/06/2010     Referrals to Alternative Service(s): Referred to Alternative Service(s):   Place:   Date:   Time:    Referred to Alternative  Service(s):   Place:   Date:   Time:    Referred to Alternative Service(s):   Place:   Date:   Time:    Referred to Alternative Service(s):   Place:   Date:   Time:     Robert Fusi MS, LCAS, [facility], [facility] Therapeutic Triage Specialist 08/10/2021 6:24 PM

## 2021-08-10 NOTE — ED Notes (Signed)
Hourly rounding reveals patient in room. No complaints, stable, in no acute distress. Q15 minute rounds and monitoring via Security Cameras to continue. 

## 2021-08-10 NOTE — ED Notes (Signed)
Belongings: 1 debit card, 2 $1 bills, blue tshirt, 1 cc, two black shoes, blue jeans, black underwear, safety glasses.

## 2021-08-10 NOTE — ED Notes (Addendum)
Report to include Situation, Background, Assessment, and Recommendations received from Amy RN. Patient alert and oriented, warm and dry, in no acute distress. Patient denies SI, HI, AVH and pain. Patient said he did something he shouldn't be doing and now he is remorseful. He said he worries about his children. Patient made aware of Q15 minute rounds and security cameras for their safety. Patient instructed to come to me with needs or concerns.

## 2021-08-11 ENCOUNTER — Encounter: Payer: Self-pay | Admitting: Psychiatric/Mental Health

## 2021-08-11 ENCOUNTER — Inpatient Hospital Stay
Admission: AD | Admit: 2021-08-11 | Discharge: 2021-08-15 | DRG: 885 | Disposition: A | Discharge status: Home / Self Care | Payer: No Typology Code available for payment source | Source: Intra-hospital | Attending: Psychiatry | Admitting: Psychiatry

## 2021-08-11 DIAGNOSIS — F3160 Bipolar disorder, current episode mixed, unspecified: Principal | ICD-10-CM | POA: Diagnosis present

## 2021-08-11 DIAGNOSIS — Z833 Family history of diabetes mellitus: Secondary | ICD-10-CM

## 2021-08-11 DIAGNOSIS — F319 Bipolar disorder, unspecified: Principal | ICD-10-CM | POA: Diagnosis present

## 2021-08-11 DIAGNOSIS — Z87891 Personal history of nicotine dependence: Secondary | ICD-10-CM | POA: Diagnosis not present

## 2021-08-11 DIAGNOSIS — Z818 Family history of other mental and behavioral disorders: Secondary | ICD-10-CM | POA: Diagnosis not present

## 2021-08-11 DIAGNOSIS — Z8249 Family history of ischemic heart disease and other diseases of the circulatory system: Secondary | ICD-10-CM

## 2021-08-11 DIAGNOSIS — Z9114 Patient's other noncompliance with medication regimen: Secondary | ICD-10-CM

## 2021-08-11 DIAGNOSIS — Z823 Family history of stroke: Secondary | ICD-10-CM | POA: Diagnosis not present

## 2021-08-11 DIAGNOSIS — E876 Hypokalemia: Secondary | ICD-10-CM | POA: Diagnosis present

## 2021-08-11 MED ORDER — POTASSIUM CHLORIDE 20 MEQ PO PACK
20.0000 meq | PACK | Freq: Once | ORAL | Status: AC
  Administered 2021-08-11: 20 meq via ORAL
  Filled 2021-08-11: qty 1

## 2021-08-11 MED ORDER — GABAPENTIN 100 MG PO CAPS
100.0000 mg | ORAL_CAPSULE | Freq: Three times a day (TID) | ORAL | Status: DC
  Administered 2021-08-11 – 2021-08-15 (×14): 100 mg via ORAL
  Filled 2021-08-11 (×14): qty 1

## 2021-08-11 MED ORDER — TRAZODONE HCL 50 MG PO TABS: 50.0000 mg | ORAL_TABLET | Freq: Every evening | ORAL | Status: DC | PRN

## 2021-08-11 MED ORDER — ALUM & MAG HYDROXIDE-SIMETH 200-200-20 MG/5ML PO SUSP
30.0000 mL | ORAL | Status: DC | PRN
  Filled 2021-08-11: qty 30

## 2021-08-11 MED ORDER — HYDROXYZINE HCL 25 MG PO TABS
25.0000 mg | ORAL_TABLET | Freq: Three times a day (TID) | ORAL | Status: DC | PRN
  Administered 2021-08-11 – 2021-08-14 (×3): 25 mg via ORAL
  Filled 2021-08-11 (×3): qty 1

## 2021-08-11 MED ORDER — TRAZODONE HCL 50 MG PO TABS
50.0000 mg | ORAL_TABLET | Freq: Every evening | ORAL | Status: DC | PRN
  Administered 2021-08-11 – 2021-08-14 (×2): 50 mg via ORAL
  Filled 2021-08-11 (×2): qty 1

## 2021-08-11 MED ORDER — ACETAMINOPHEN 325 MG PO TABS: 650.0000 mg | ORAL_TABLET | Freq: Four times a day (QID) | ORAL | Status: DC | PRN

## 2021-08-11 MED ORDER — DIVALPROEX SODIUM 500 MG PO DR TAB
500.0000 mg | DELAYED_RELEASE_TABLET | Freq: Two times a day (BID) | ORAL | Status: DC
  Administered 2021-08-11 – 2021-08-13 (×5): 500 mg via ORAL
  Filled 2021-08-11 (×5): qty 1

## 2021-08-11 MED ORDER — MAGNESIUM HYDROXIDE 400 MG/5ML PO SUSP: 30.0000 mL | Freq: Every day | ORAL | Status: DC | PRN

## 2021-08-11 MED ORDER — RISPERIDONE 1 MG PO TABS
1.0000 mg | ORAL_TABLET | Freq: Two times a day (BID) | ORAL | Status: DC
  Administered 2021-08-11 – 2021-08-13 (×5): 1 mg via ORAL
  Filled 2021-08-11 (×5): qty 1

## 2021-08-11 NOTE — H&P (Addendum)
Psychiatric Admission Assessment Adult  Patient Identification: Robert Holmes MRN:  [id number] Date of Evaluation:  08/11/2021 Chief Complaint:  "I can't figure it out." Diagnosis:   Bipolar 1 disorder, mixed, with psychotic features Medication noncompliance Hypokalemia  History of Present Illness:  Robert Holmes is a 40 year old male with a history of bipolar disorder who was brought in by law enforcement after he called 911 due to feeling unsafe.  "I felt like something bad was going to happen."  Reports feeling overwhelmed and "could not figure out what was going on."  Claims that he broke into his mother's house but his mother told him that he did not, "I felt so sad about it, I told the sheriff."  He reports feeling paranoid over the past 2 days that people were following him.  Other associated symptoms are unrelenting racing thoughts and poor sleep.   Reports that he was last on psychiatric medications several months ago though he does not exactly know when.  He does not know the details of what medications he tried in the past.  Says the last ones that he was on were "too strong," and had excessive daytime sedation on them, and so stopped them.   Indicates experienced auditory hallucinations intermittently over the years during stressful times.  Denies recent suicidal or homicidal ideations.  Denies the use of drugs recently.  Says that he used cannabis years ago.  Patient is employed as a Nature conservation officer and is currently on disability for mental health.  Associated Signs/Symptoms: Depression Symptoms:  depressed mood, anhedonia, difficulty concentrating, Duration of Depression Symptoms: Less than two weeks  Total Time spent with patient: 1 hour  Past Psychiatric History:  Patient was admitted to [facility] unit in 2015.  Believes that he was admitted somewhere in 2018 as well.  He is supposed to see a psychiatric provider at [facility] next week.  He has been off his  medications.  Unknown medication trials in the past.  However, records indicate him being on lithium and Zoloft in the past.  Is the patient at risk to self? Yes.    Has the patient been a risk to self in the past 6 months? No.  Has the patient been a risk to self within the distant past? Yes.    Is the patient a risk to others? No.  Has the patient been a risk to others in the past 6 months? No.  Has the patient been a risk to others within the distant past? No.   Prior Inpatient Therapy:   Prior Outpatient Therapy:    Alcohol Screening: 1. How often do you have a drink containing alcohol?: Never 2. How many drinks containing alcohol do you have on a typical day when you are drinking?: 1 or 2 3. How often do you have six or more drinks on one occasion?: Never AUDIT-C Score: 0 4. How often during the last year have you found that you were not able to stop drinking once you had started?: Never 5. How often during the last year have you failed to do what was normally expected from you because of drinking?: Never 6. How often during the last year have you needed a first drink in the morning to get yourself going after a heavy drinking session?: Never 7. How often during the last year have you had a feeling of guilt of remorse after drinking?: Never 8. How often during the last year have you been unable to remember what happened the  night before because you had been drinking?: Never 9. Have you or someone else been injured as a result of your drinking?: No 10. Has a relative or friend or a doctor or another health worker been concerned about your drinking or suggested you cut down?: No Alcohol Use Disorder Identification Test Final Score (AUDIT): 0 Alcohol Brief Interventions/Follow-up: Alcohol education/Brief advice Substance Abuse History in the last 12 months:  Yes.    Past Medical History:  Past Medical History:  Diagnosis Date   Bipolar disorder (HCC)    Depression    Panic attack      Past Surgical History:  Procedure Laterality Date   TONSILLECTOMY     Family History:  Family History  Problem Relation Age of Onset   Anxiety disorder Mother    Depression Mother    Panic disorder Mother    Hypertension Father    Stroke Father    Diabetes Paternal Grandfather    Family Psychiatric  History:  Tobacco Screening:   Social History: Patient have 3 kids.  Is disabled for mental illness.  He lives alone. Social History   Substance and Sexual Activity  Alcohol Use No   Alcohol/week: 0.0 standard drinks     Social History   Substance and Sexual Activity  Drug Use No    Additional Social History: Marital status: Divorced Divorced, when?: 2017 What types of issues is patient dealing with in the relationship?: Patient states when his 3rd child was born, "I couldnt handle it." Are you sexually active?: No What is your sexual orientation?: "Straight" Has your sexual activity been affected by drugs, alcohol, medication, or emotional stress?: Pt denies Does patient have children?: Yes How many children?: 3 How is patient's relationship with their children?: "I'm not allowed to talk to any of them." Patient states his wages are garnished for child support.                         Allergies:  No Known Allergies Lab Results:  Results for orders placed or performed during the hospital encounter of 08/10/21 (from the past 48 hour(s))  Comprehensive metabolic panel     Status: Abnormal   Collection Time: 08/10/21  2:58 PM  Result Value Ref Range   Sodium 136 135 - 145 mmol/L   Potassium 3.1 (L) 3.5 - 5.1 mmol/L   Chloride 100 98 - 111 mmol/L   CO2 27 22 - 32 mmol/L   Glucose, Bld 125 (H) 70 - 99 mg/dL    Comment: Glucose reference range applies only to samples taken after fasting for at least 8 hours.   BUN 17 6 - 20 mg/dL   Creatinine, Ser 2.95 0.61 - 1.24 mg/dL   Calcium 9.5 8.9 - 18.8 mg/dL   Total Protein 8.4 (H) 6.5 - 8.1 g/dL   Albumin 5.2 (H)  3.5 - 5.0 g/dL   AST 35 15 - 41 U/L   ALT 29 0 - 44 U/L   Alkaline Phosphatase 63 38 - 126 U/L   Total Bilirubin 1.8 (H) 0.3 - 1.2 mg/dL   GFR, Estimated >41 >66 mL/min    Comment: (NOTE) Calculated using the CKD-EPI Creatinine Equation (2021)    Anion gap 9 5 - 15    Comment: Performed at [facility], [address], [place], [place] [address]  Ethanol     Status: None   Collection Time: 08/10/21  2:58 PM  Result Value Ref Range   Alcohol,  Ethyl (B) <10 <10 mg/dL    Comment: (NOTE) Lowest detectable limit for serum alcohol is 10 mg/dL.  For medical purposes only. Performed at [facility], [address] [place], [place] [address]   Salicylate level     Status: Abnormal   Collection Time: 08/10/21  2:58 PM  Result Value Ref Range   Salicylate Lvl <7.0 (L) 7.0 - 30.0 mg/dL    Comment: Performed at [facility], [address] [place], [place] [address]  Acetaminophen level     Status: Abnormal   Collection Time: 08/10/21  2:58 PM  Result Value Ref Range   Acetaminophen (Tylenol), Serum <10 (L) 10 - 30 ug/mL    Comment: (NOTE) Therapeutic concentrations vary significantly. A range of 10-30 ug/mL  may be an effective concentration for many patients. However, some  are best treated at concentrations outside of this range. Acetaminophen concentrations >150 ug/mL at 4 hours after ingestion  and >50 ug/mL at 12 hours after ingestion are often associated with  toxic reactions.  Performed at [facility], [address] [place], [place] [address]   cbc     Status: None   Collection Time: 08/10/21  2:58 PM  Result Value Ref Range   WBC 6.7 4.0 - 10.5 K/uL   RBC 4.82 4.22 - 5.81 MIL/uL   Hemoglobin 14.9 13.0 - 17.0 g/dL   HCT 91.4 78.2 - 95.6 %   MCV 91.5 80.0 - 100.0 fL   MCH 30.9 26.0 - 34.0 pg   MCHC 33.8 30.0 - 36.0 g/dL   RDW 21.3 08.6 - 57.8 %   Platelets 331 150 - 400 K/uL   nRBC 0.0 0.0 - 0.2 %    Comment: Performed at  [facility], [address] [place], [place] [address]  Urine Drug Screen, Qualitative     Status: None   Collection Time: 08/10/21  2:58 PM  Result Value Ref Range   Tricyclic, Ur Screen NONE DETECTED NONE DETECTED   Amphetamines, Ur Screen NONE DETECTED NONE DETECTED   MDMA (Ecstasy)Ur Screen NONE DETECTED NONE DETECTED   Cocaine Metabolite,Ur [place] NONE DETECTED NONE DETECTED   Opiate, Ur Screen NONE DETECTED NONE DETECTED   Phencyclidine (PCP) Ur S NONE DETECTED NONE DETECTED   Cannabinoid 50 Ng, Ur [place] NONE DETECTED NONE DETECTED   Barbiturates, Ur Screen NONE DETECTED NONE DETECTED   Benzodiazepine, Ur Scrn NONE DETECTED NONE DETECTED   Methadone Scn, Ur NONE DETECTED NONE DETECTED    Comment: (NOTE) Tricyclics + metabolites, urine    Cutoff 1000 ng/mL Amphetamines + metabolites, urine  Cutoff 1000 ng/mL MDMA (Ecstasy), urine              Cutoff 500 ng/mL Cocaine Metabolite, urine          Cutoff 300 ng/mL Opiate + metabolites, urine        Cutoff 300 ng/mL Phencyclidine (PCP), urine         Cutoff 25 ng/mL Cannabinoid, urine                 Cutoff 50 ng/mL Barbiturates + metabolites, urine  Cutoff 200 ng/mL Benzodiazepine, urine              Cutoff 200 ng/mL Methadone, urine                   Cutoff 300 ng/mL  The urine drug screen provides only a preliminary, unconfirmed analytical test result and should not be used  for non-medical purposes. Clinical consideration and professional judgment should be applied to any positive drug screen result due to possible interfering substances. A more specific alternate chemical method must be used in order to obtain a confirmed analytical result. Gas chromatography / mass spectrometry (GC/MS) is the preferred confirm atory method. Performed at [facility], [address] [place], [place] [address]   Resp Panel by RT-PCR (Flu A&B, Covid) Nasopharyngeal Swab     Status: None   Collection Time: 08/10/21  4:40 PM    Specimen: Nasopharyngeal Swab; Nasopharyngeal(NP) swabs in vial transport medium  Result Value Ref Range   SARS Coronavirus 2 by RT PCR NEGATIVE NEGATIVE    Comment: (NOTE) SARS-CoV-2 target nucleic acids are NOT DETECTED.  The SARS-CoV-2 RNA is generally detectable in upper respiratory specimens during the acute phase of infection. The lowest concentration of SARS-CoV-2 viral copies this assay can detect is 138 copies/mL. A negative result does not preclude SARS-Cov-2 infection and should not be used as the sole basis for treatment or other patient management decisions. A negative result may occur with  improper specimen collection/handling, submission of specimen other than nasopharyngeal swab, presence of viral mutation(s) within the areas targeted by this assay, and inadequate number of viral copies(<138 copies/mL). A negative result must be combined with clinical observations, patient history, and epidemiological information. The expected result is Negative.  Fact Sheet for Patients:  BloggerCourse.com  Fact Sheet for Healthcare Providers:  SeriousBroker.it  This test is no t yet approved or cleared by the [place] FDA and  has been authorized for detection and/or diagnosis of SARS-CoV-2 by FDA under an Emergency Use Authorization (EUA). This EUA will remain  in effect (meaning this test can be used) for the duration of the COVID-19 declaration under Section 564(b)(1) of the Act, 21 U.S.C.section 360bbb-3(b)(1), unless the authorization is terminated  or revoked sooner.       Influenza A by PCR NEGATIVE NEGATIVE   Influenza B by PCR NEGATIVE NEGATIVE    Comment: (NOTE) The Xpert Xpress SARS-CoV-2/FLU/RSV plus assay is intended as an aid in the diagnosis of influenza from Nasopharyngeal swab specimens and should not be used as a sole basis for treatment. Nasal washings and aspirates are unacceptable for Xpert Xpress  SARS-CoV-2/FLU/RSV testing.  Fact Sheet for Patients: BloggerCourse.com  Fact Sheet for Healthcare Providers: SeriousBroker.it  This test is not yet approved or cleared by the [place] FDA and has been authorized for detection and/or diagnosis of SARS-CoV-2 by FDA under an Emergency Use Authorization (EUA). This EUA will remain in effect (meaning this test can be used) for the duration of the COVID-19 declaration under Section 564(b)(1) of the Act, 21 U.S.C. section 360bbb-3(b)(1), unless the authorization is terminated or revoked.  Performed at [facility], [address] [place], [place] [address]     Blood Alcohol level:  Lab Results  Component Value Date   [facility] <10 08/10/2021   ETH <10 06/26/2020    Metabolic Disorder Labs:  Lab Results  Component Value Date   HGBA1C 5.4 08/09/2014   No results found for: PROLACTIN Lab Results  Component Value Date   CHOL 172 08/09/2014   TRIG 247 (H) 08/09/2014   HDL 27 (L) 08/09/2014   VLDL 49 (H) 08/09/2014   LDLCALC 96 08/09/2014    Current Medications: Current Facility-Administered Medications  Medication Dose Route Frequency Provider Last Rate Last Admin   acetaminophen (TYLENOL) tablet 650 mg  650 mg Oral Q6H PRN Dixon,  Elray Buba, NP       alum & mag hydroxide-simeth (MAALOX/MYLANTA) 200-200-20 MG/5ML suspension 30 mL  30 mL Oral Q4H PRN Durwin Nora, Rashaun M, NP       divalproex (DEPAKOTE) DR tablet 500 mg  500 mg Oral Q12H Dixon, Rashaun M, NP   500 mg at 08/11/21 9323   gabapentin (NEURONTIN) capsule 100 mg  100 mg Oral TID Jearld Lesch, NP   100 mg at 08/11/21 5573   hydrOXYzine (ATARAX/VISTARIL) tablet 25 mg  25 mg Oral TID PRN Jearld Lesch, NP       magnesium hydroxide (MILK OF MAGNESIA) suspension 30 mL  30 mL Oral Daily PRN Jearld Lesch, NP       risperiDONE (RISPERDAL) tablet 1 mg  1 mg Oral BID Dixon, Rashaun M, NP   1 mg at 08/11/21 2202    traZODone (DESYREL) tablet 50 mg  50 mg Oral QHS PRN Jearld Lesch, NP       PTA Medications: No medications prior to admission.  Musculoskeletal: Strength & Muscle Tone: within normal limits Gait & Station: normal Patient leans: Right                       Psychiatric Specialty Exam:   Presentation  General Appearance: Appropriate for Environment   Eye Contact:Fair   Speech:Clear and Coherent   Speech Volume:Decreased   Handedness:Right     Mood and Affect  Mood:Scared   Affect: contricted     Thought Process  Thought Processes:Coherent   Duration of Psychotic Symptoms: No data recorded Past Diagnosis of Schizophrenia or Psychoactive disorder: No   Descriptions of Associations:Intact   Orientation:Full (Time, Place and Person)   Thought Content:WDL; Logical   Hallucinations:Hallucinations: yes   Ideas of Reference:None   Suicidal Thoughts:Suicidal Thoughts: NO Homicidal Thoughts:Homicidal Thoughts: No     Sensorium  Memory:Immediate Fair   Judgment:Impaired   Insight:poor     Executive Functions  Concentration:Poor   Attention Span:poor   Recall:Fair   Fund of Knowledge:Fair   Language:Fair     Psychomotor Activity  Psychomotor Activity:Psychomotor Activity: Normal     Assets  Assets:Desire for Improvement; Financial Resources/Insurance; Social Support     Sleep  Sleep:Sleep: Poor   Physical Exam: Physical Exam ROS Blood pressure (!) 143/88, pulse (!) 101, temperature 98.3 F (36.8 C), temperature source Oral, resp. rate 17, height 5\' 11"  (1.803 m), weight 95.7 kg, SpO2 100 %. Body mass index is 29.43 kg/m.  Treatment Plan Summary: Daily contact with patient to assess and evaluate symptoms and progress in treatment  Observation Level/Precautions:    Laboratory:    Psychotherapy:    Medications:    Consultations:    Discharge Concerns:    Estimated LOS:  Other:     Physician Treatment Plan for Primary  Diagnosis: Bipolar 1 disorder (HCC) Long Term Goal(s): Improvement in symptoms so as ready for discharge  Short Term Goals: start medications  Physician Treatment Plan for Secondary Diagnosis: Principal Problem:   Bipolar 1 disorder (HCC)  Long Term Goal(s): Improvement in symptoms so as ready for discharge  Short Term Goals: Ability to identify and develop effective coping behaviors will improve  11/19 Patient was started on Risperdal 1 mg p.o. twice daily Depakote 500 mg p.o. twice daily, gabapentin 100 mg 3 times daily and trazodone on a as needed basis for insomnia. Consider long-acting injectable Depakote level in 5 days Potassium 3.1. Provide Klor Con 20 mEq  x 1. Recheck tomorrow AM Lipid panel and HbA1c tomorrow Provide the patient with structure and support Labs and tests reviewed Chart reviewed  CPT: 72 minutes. Greater than 50% of time was spent counseling and coordination of care.   I certify that inpatient services furnished can reasonably be expected to improve the patient's condition.    Reggie Pile, MD 11/19/202211:31 AM

## 2021-08-11 NOTE — ED Notes (Signed)
Patient is stable in NAD. He is transferred to [facility] via NT and officer. He is medically cleared to go to BMU per Dr. Elesa Massed. Report given to [facility] RN Britta Mccreedy. Patient belongings and IVC paper given to the unit. No issues.

## 2021-08-11 NOTE — Plan of Care (Signed)
  Problem: Education: Goal: Knowledge of [facility] [facility] Education information/materials will improve Outcome: Not Progressing Goal: Emotional status will improve Outcome: Not Progressing Goal: Mental status will improve Outcome: Not Progressing Goal: Verbalization of understanding the information provided will improve Outcome: Not Progressing   Problem: Activity: Goal: Interest or engagement in activities will improve Outcome: Not Progressing Goal: Sleeping patterns will improve Outcome: Not Progressing   Problem: Coping: Goal: Ability to verbalize frustrations and anger appropriately will improve Outcome: Not Progressing Goal: Ability to demonstrate self-control will improve Outcome: Not Progressing   Problem: Health Behavior/Discharge Planning: Goal: Identification of resources available to assist in meeting health care needs will improve Outcome: Not Progressing Goal: Compliance with treatment plan for underlying cause of condition will improve Outcome: Not Progressing   Problem: Physical Regulation: Goal: Ability to maintain clinical measurements within normal limits will improve Outcome: Not Progressing   Problem: Safety: Goal: Periods of time without injury will increase Outcome: Not Progressing   Problem: Activity: Goal: Will verbalize the importance of balancing activity with adequate rest periods Outcome: Not Progressing   Problem: Education: Goal: Will be free of psychotic symptoms Outcome: Not Progressing Goal: Knowledge of the prescribed therapeutic regimen will improve Outcome: Not Progressing   Problem: Coping: Goal: Coping ability will improve Outcome: Not Progressing Goal: Will verbalize feelings Outcome: Not Progressing   Problem: Health Behavior/Discharge Planning: Goal: Compliance with prescribed medication regimen will improve Outcome: Not Progressing   Problem: Nutritional: Goal: Ability to achieve adequate nutritional intake will  improve Outcome: Not Progressing   Problem: Role Relationship: Goal: Ability to communicate needs accurately will improve Outcome: Not Progressing Goal: Ability to interact with others will improve Outcome: Not Progressing   Problem: Safety: Goal: Ability to redirect hostility and anger into socially appropriate behaviors will improve Outcome: Not Progressing Goal: Ability to remain free from injury will improve Outcome: Not Progressing   Problem: Self-Care: Goal: Ability to participate in self-care as condition permits will improve Outcome: Not Progressing   Problem: Self-Concept: Goal: Will verbalize positive feelings about self Outcome: Not Progressing   

## 2021-08-11 NOTE — ED Notes (Signed)
Hourly rounding reveals patient in room. No complaints, stable, in no acute distress. Q15 minute rounds and monitoring via Security Cameras to continue. 

## 2021-08-11 NOTE — Tx Team (Signed)
Initial Treatment Plan 08/11/2021 3:20 AM [name] RKY:[id number]    PATIENT STRESSORS: Marital or family conflict   Medication change or noncompliance     PATIENT STRENGTHS: Ability for insight  Average or above average intelligence  Capable of independent living  Communication skills  General fund of knowledge  Physical Health  Supportive family/friends    PATIENT IDENTIFIED PROBLEMS:     psychosis                 DISCHARGE CRITERIA:  Ability to meet basic life and health needs Adequate post-discharge living arrangements Improved stabilization in mood, thinking, and/or behavior Medical problems require only outpatient monitoring Motivation to continue treatment in a less acute level of care Need for constant or close observation no longer present Reduction of life-threatening or endangering symptoms to within safe limits Safe-care adequate arrangements made Verbal commitment to aftercare and medication compliance  PRELIMINARY DISCHARGE PLAN: Outpatient therapy Return to previous living arrangement  PATIENT/FAMILY INVOLVEMENT: This treatment plan has been presented to and reviewed with the patient, Robert Holmes, and/or family member.  The patient and family have been given the opportunity to ask questions and make suggestions.  Billy Coast, RN 08/11/2021, 3:20 AM

## 2021-08-11 NOTE — Progress Notes (Signed)
Patient admitted after calling the police on himself for breaking into his mother's house and flushing the supplement he gave her to gain weight into the toilet. Upon presentation, he denies SI, HI and AVH, but his thoughts are disorganized. He is worried about losing his job due to his being here. He says he is worried about losing his job and not being able to pay his child support. His mood is anxious and his affect is congruent. Skin search done with Kenard Gower, MHT and no contraband, wounds, or lesions were found. The patient is able to contract for safety on the unit.

## 2021-08-11 NOTE — Group Note (Signed)
LCSW Group Therapy Note  Group Date: 08/11/2021 Start Time: 1315 End Time: 1405   Type of Therapy and Topic:  Group Therapy - Healthy vs Unhealthy Coping Skills  Participation Level:  Did Not Attend   Description of Group The focus of this group was to determine what unhealthy coping techniques typically are used by group members and what healthy coping techniques would be helpful in coping with various problems. Patients were guided in becoming aware of the differences between healthy and unhealthy coping techniques. Patients were asked to identify 2-3 healthy coping skills they would like to learn to use more effectively.  Therapeutic Goals Patients learned that coping is what human beings do all day long to deal with various situations in their lives Patients defined and discussed healthy vs unhealthy coping techniques Patients identified their preferred coping techniques and identified whether these were healthy or unhealthy Patients determined 2-3 healthy coping skills they would like to become more familiar with and use more often. Patients provided support and ideas to each other   Summary of Patient Progress: Patient did not attend group despite encouraged participation.    Therapeutic Modalities Cognitive Behavioral Therapy Motivational Interviewing  Norberto Sorenson, Theresia Majors 08/11/2021  5:30 PM

## 2021-08-11 NOTE — BH Assessment (Signed)
Patient is to be admitted to [facility] by Psychiatric Nurse Practitioner Rashaun.  Attending Physician will be Dr.  Toni Amend .   Patient has been assigned to room 310, by [facility] Charge Nurse B[place].   Intake Paper Work has been signed and placed on patient chart.  ER staff is aware of the admission: Melody, ER Secretary   Dr. Toni Amend, ER MD  Lacinda Axon, Patient's Nurse  Rosey Bath, Patient Access.

## 2021-08-11 NOTE — ED Notes (Signed)
VS not taken, patient asleep 

## 2021-08-11 NOTE — Progress Notes (Signed)
Patient is alert and cooperative. Denies SI, HI. Report anxiety and depression rating 10/10. Report auditory hallucination. Patient stated  " the voices is telling me about my kids". Compliant with medication. Report appetite as fair. Denies pain. Remain safe on the unit with q15 minute safety checks.

## 2021-08-12 LAB — LIPID PANEL
Cholesterol: 169 mg/dL (ref 0–200)
HDL: 34 mg/dL — ABNORMAL LOW (ref >40)
LDL Cholesterol: 121 mg/dL — ABNORMAL HIGH (ref 0–99)
Total CHOL/HDL Ratio: 5 RATIO
Triglycerides: 72 mg/dL (ref <150)
VLDL: 14 mg/dL (ref 0–40)

## 2021-08-12 LAB — HEMOGLOBIN A1C
Hgb A1c MFr Bld: 5 % (ref 4.8–5.6)
Mean Plasma Glucose: 96.8 mg/dL

## 2021-08-12 LAB — POTASSIUM: Potassium: 3.9 mmol/L (ref 3.5–5.1)

## 2021-08-12 NOTE — Progress Notes (Signed)
Patient alert and oriented x 4, affect is blunted thoughts are organized and coherent, he denies SI/HI/AVH interacted briefly with peers and staff during the evening shift no distress noted, he was complaint with medication. 15 minutes safety checks maintained.
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
## 2021-08-12 NOTE — Progress Notes (Signed)
Patient alert and oriented this shift. Present on milieu for meals. Needs prompting for meals. Denies SI/HI/AH/VH and pain. Endorses anxiety at a 2/10 and depression 1/10. Prn medication given. Isolates most of the shift but is pleasant on interaction.   No s/s of adverse reactions from medication. Cont Q15 minute check for safety.

## 2021-08-12 NOTE — Group Note (Signed)
LCSW Group Therapy Note  Group Date: 08/12/2021 Start Time: 1315 End Time: 1405   Type of Therapy and Topic:  Group Therapy - How To Cope with Nervousness about Discharge   Participation Level:  Active   Description of Group This process group involved identification of patients' feelings about discharge. Some of them are scheduled to be discharged soon, while others are new admissions, but each of them was asked to share thoughts and feelings surrounding discharge from the hospital. One common theme was that they are excited at the prospect of going home, while another was that many of them are apprehensive about sharing why they were hospitalized. Patients were given the opportunity to discuss these feelings with their peers in preparation for discharge.  Therapeutic Goals  Patient will identify their overall feelings about pending discharge. Patient will think about how they might proactively address issues that they believe will once again arise once they get home (i.e. with parents). Patients will participate in discussion about having hope for change.   Summary of Patient Progress: Patient participated in group. Patient presented as reserved and appeared tired; however, remained cooperative throughout the session. Patient stated that he is ready to go back to work. Patient shared he plans to take his medication routinely and believes he will be okay as a result.   Therapeutic Modalities Cognitive Behavioral Therapy   Marletta Lor 08/12/2021  2:39 PM

## 2021-08-12 NOTE — BHH Suicide Risk Assessment (Deleted)
B[facility] INPATIENT:  Family/Significant Other Suicide Prevention Education  Suicide Prevention Education:  Patient Refusal for Family/Significant Other Suicide Prevention Education: The patient Robert Holmes has refused to provide written consent for family/significant other to be provided Family/Significant Other Suicide Prevention Education during admission and/or prior to discharge.  Physician notified.  SPE completed with pt, as pt refused to consent to family contact. SPI pamphlet provided to pt and pt was encouraged to share information with support network, ask questions, and talk about any concerns relating to SPE. Pt denies access to guns/firearms and verbalized understanding of information provided. Mobile Crisis information also provided to pt.    Ileana Ladd Kalei Mckillop 08/12/2021, 8:41 AM

## 2021-08-12 NOTE — Plan of Care (Signed)
  Problem: Education: Goal: Knowledge of [facility] Education information/materials will improve Outcome: Progressing Goal: Emotional status will improve Outcome: Progressing Goal: Mental status will improve Outcome: Progressing Goal: Verbalization of understanding the information provided will improve Outcome: Progressing   Problem: Activity: Goal: Interest or engagement in activities will improve Outcome: Progressing Goal: Sleeping patterns will improve Outcome: Progressing   Problem: Coping: Goal: Ability to verbalize frustrations and anger appropriately will improve Outcome: Progressing Goal: Ability to demonstrate self-control will improve Outcome: Progressing   Problem: Health Behavior/Discharge Planning: Goal: Identification of resources available to assist in meeting health care needs will improve Outcome: Progressing Goal: Compliance with treatment plan for underlying cause of condition will improve Outcome: Progressing   Problem: Physical Regulation: Goal: Ability to maintain clinical measurements within normal limits will improve Outcome: Progressing   Problem: Safety: Goal: Periods of time without injury will increase Outcome: Progressing   Problem: Activity: Goal: Will verbalize the importance of balancing activity with adequate rest periods Outcome: Progressing   Problem: Education: Goal: Will be free of psychotic symptoms Outcome: Progressing Goal: Knowledge of the prescribed therapeutic regimen will improve Outcome: Progressing   Problem: Coping: Goal: Coping ability will improve Outcome: Progressing Goal: Will verbalize feelings Outcome: Progressing   Problem: Health Behavior/Discharge Planning: Goal: Compliance with prescribed medication regimen will improve Outcome: Progressing   Problem: Nutritional: Goal: Ability to achieve adequate nutritional intake will improve Outcome: Progressing   Problem: Role Relationship: Goal:  Ability to communicate needs accurately will improve Outcome: Progressing Goal: Ability to interact with others will improve Outcome: Progressing   Problem: Safety: Goal: Ability to redirect hostility and anger into socially appropriate behaviors will improve Outcome: Progressing Goal: Ability to remain free from injury will improve Outcome: Progressing   Problem: Role Relationship: Goal: Ability to communicate needs accurately will improve Outcome: Progressing Goal: Ability to interact with others will improve Outcome: Progressing   Problem: Self-Concept: Goal: Will verbalize positive feelings about self Outcome: Progressing   Problem: Self-Care: Goal: Ability to participate in self-care as condition permits will improve Outcome: Progressing

## 2021-08-12 NOTE — BHH Suicide Risk Assessment (Signed)
B[facility] INPATIENT:  Family/Significant Other Suicide Prevention Education  Suicide Prevention Education:  Education Completed; Bjorn Loser (mother) [phone], has been identified by the patient as the family member/significant other with whom the patient will be residing, and identified as the person(s) who will aid the patient in the event of a mental health crisis (suicidal ideations/suicide attempt).  With written consent from the patient, the family member/significant other has been provided the following suicide prevention education, prior to the and/or following the discharge of the patient.  The suicide prevention education provided includes the following: Suicide risk factors Suicide prevention and interventions National Suicide Hotline telephone number [facility] assessment telephone number [facility] Emergency Assistance 911 [facility] and/or Residential Mobile Crisis Unit telephone number  Request made of family/significant other to: Remove weapons (e.g., guns, rifles, knives), all items previously/currently identified as safety concern.   Remove drugs/medications (over-the-counter, prescriptions, illicit drugs), all items previously/currently identified as a safety concern.  The family member/significant other verbalizes understanding of the suicide prevention education information provided.  The family member/significant other agrees to remove the items of safety concern listed above.  Patient's mother Bjorn Loser states patient has not dicussed suicidal ideation and has no past suicide attempts.  Ileana Ladd Dinnis Rog 08/12/2021, 4:16 PM
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
## 2021-08-13 MED ORDER — BENZTROPINE MESYLATE 1 MG PO TABS
0.5000 mg | ORAL_TABLET | Freq: Two times a day (BID) | ORAL | Status: DC
  Administered 2021-08-13 – 2021-08-15 (×4): 0.5 mg via ORAL
  Filled 2021-08-13 (×4): qty 1

## 2021-08-13 MED ORDER — RISPERIDONE 1 MG PO TABS
2.0000 mg | ORAL_TABLET | Freq: Two times a day (BID) | ORAL | Status: DC
  Administered 2021-08-13 – 2021-08-15 (×4): 2 mg via ORAL
  Filled 2021-08-13 (×4): qty 2

## 2021-08-13 MED ORDER — LITHIUM CARBONATE ER 450 MG PO TBCR
450.0000 mg | EXTENDED_RELEASE_TABLET | Freq: Two times a day (BID) | ORAL | Status: DC
  Administered 2021-08-13 – 2021-08-15 (×5): 450 mg via ORAL
  Filled 2021-08-13 (×5): qty 1

## 2021-08-13 NOTE — BH IP Treatment Plan (Signed)
Interdisciplinary Treatment and Diagnostic Plan Update  08/13/2021 Time of Session: 9:30 AM MARQUI FORMBY III MRN: [id number]  Principal Diagnosis: Bipolar 1 disorder ([place])  Secondary Diagnoses: Principal Problem:   Bipolar 1 disorder ([place])   Current Medications:  Current Facility-Administered Medications  Medication Dose Route Frequency Provider Last Rate Last Admin   acetaminophen (TYLENOL) tablet 650 mg  650 mg Oral Q6H PRN Deloria Lair, NP       alum & mag hydroxide-simeth (MAALOX/MYLANTA) 200-200-20 MG/5ML suspension 30 mL  30 mL Oral Q4H PRN Doren Custard, Rashaun M, NP       divalproex (DEPAKOTE) DR tablet 500 mg  500 mg Oral Q12H Dixon, Rashaun M, NP   500 mg at 08/13/21 0743   gabapentin (NEURONTIN) capsule 100 mg  100 mg Oral TID Deloria Lair, NP   100 mg at 08/13/21 0743   hydrOXYzine (ATARAX/VISTARIL) tablet 25 mg  25 mg Oral TID PRN Deloria Lair, NP   25 mg at 08/12/21 0900   magnesium hydroxide (MILK OF MAGNESIA) suspension 30 mL  30 mL Oral Daily PRN Deloria Lair, NP       risperiDONE (RISPERDAL) tablet 1 mg  1 mg Oral BID Dixon, Rashaun M, NP   1 mg at 08/13/21 0743   traZODone (DESYREL) tablet 50 mg  50 mg Oral QHS PRN Deloria Lair, NP   50 mg at 08/11/21 2141   PTA Medications: No medications prior to admission.    Patient Stressors: Marital or family conflict   Medication change or noncompliance    Patient Strengths: Ability for insight  Average or above average intelligence  Capable of independent living  Communication skills  General fund of knowledge  Physical Health  Supportive family/friends   Treatment Modalities: Medication Management, Group therapy, Case management,  1 to 1 session with clinician, Psychoeducation, Recreational therapy.   Physician Treatment Plan for Primary Diagnosis: Bipolar 1 disorder ([place]) Long Term Goal(s): Improvement in symptoms so as ready for discharge   Short Term Goals: Ability to identify and develop  effective coping behaviors will improve  Medication Management: Evaluate patient's response, side effects, and tolerance of medication regimen.  Therapeutic Interventions: 1 to 1 sessions, Unit Group sessions and Medication administration.  Evaluation of Outcomes: Not Met  Physician Treatment Plan for Secondary Diagnosis: Principal Problem:   Bipolar 1 disorder ([place])  Long Term Goal(s): Improvement in symptoms so as ready for discharge   Short Term Goals: Ability to identify and develop effective coping behaviors will improve     Medication Management: Evaluate patient's response, side effects, and tolerance of medication regimen.  Therapeutic Interventions: 1 to 1 sessions, Unit Group sessions and Medication administration.  Evaluation of Outcomes: Not Met   RN Treatment Plan for Primary Diagnosis: Bipolar 1 disorder ([place]) Long Term Goal(s): Knowledge of disease and therapeutic regimen to maintain health will improve  Short Term Goals: Ability to remain free from injury will improve, Ability to verbalize frustration and anger appropriately will improve, Ability to demonstrate self-control, Ability to participate in decision making will improve, Ability to verbalize feelings will improve, Ability to disclose and discuss suicidal ideas, Ability to identify and develop effective coping behaviors will improve, and Compliance with prescribed medications will improve  Medication Management: RN will administer medications as ordered by provider, will assess and evaluate patient's response and provide education to patient for prescribed medication. RN will report any adverse and/or side effects to prescribing provider.  Therapeutic Interventions: 1 on 1  counseling sessions, Psychoeducation, Medication administration, Evaluate responses to treatment, Monitor vital signs and CBGs as ordered, Perform/monitor CIWA, COWS, AIMS and Fall Risk screenings as ordered, Perform wound care treatments as  ordered.  Evaluation of Outcomes: Not Met   LCSW Treatment Plan for Primary Diagnosis: Bipolar 1 disorder ([place]) Long Term Goal(s): Safe transition to appropriate next level of care at discharge, Engage patient in therapeutic group addressing interpersonal concerns.  Short Term Goals: Engage patient in aftercare planning with referrals and resources, Increase social support, Increase ability to appropriately verbalize feelings, Increase emotional regulation, Facilitate acceptance of mental health diagnosis and concerns, and Increase skills for wellness and recovery  Therapeutic Interventions: Assess for all discharge needs, 1 to 1 time with Social worker, Explore available resources and support systems, Assess for adequacy in community support network, Educate family and significant other(s) on suicide prevention, Complete Psychosocial Assessment, Interpersonal group therapy.  Evaluation of Outcomes: Not Met   Progress in Treatment: Attending groups: No. Participating in groups: No. Taking medication as prescribed: Yes. Toleration medication: Yes. Family/Significant other contact made: Yes, individual(s) contacted:  Marisue Brooklyn, mother. Patient understands diagnosis: No. Discussing patient identified problems/goals with staff: Yes. Medical problems stabilized or resolved: Yes. Denies suicidal/homicidal ideation: No. Issues/concerns per patient self-inventory: No. Other: None.  New problem(s) identified: No, Describe:  none.  New Short Term/Long Term Goal(s): elimination of symptoms of psychosis, medication management for mood stabilization; elimination of SI thoughts; development of comprehensive mental wellness plan.  Patient Goals:  "Everything says go to jail." Patient does not appear to feel well psychiatrically and agreed that he would like to feel better.  Discharge Plan or Barriers: CSW will assist pt with development of an appropriate aftercare/discharge plan.    Reason  for Continuation of Hospitalization: Anxiety Hallucinations Medication stabilization  Estimated Length of Stay: 1-7 days   Scribe for Treatment Team: Shirl Harris, LCSW 08/13/2021 10:21 AM

## 2021-08-13 NOTE — Group Note (Signed)
[facility] LCSW Group Therapy Note    Group Date: 08/13/2021 Start Time: 1310 End Time: 1400  Type of Therapy and Topic:  Group Therapy:  Overcoming Obstacles  Participation Level:  [facility] PARTICIPATION LEVEL: Active  Mood:  Description of Group:   In this group patients will be encouraged to explore what they see as obstacles to their own wellness and recovery. They will be guided to discuss their thoughts, feelings, and behaviors related to these obstacles. The group will process together ways to cope with barriers, with attention given to specific choices patients can make. Each patient will be challenged to identify changes they are motivated to make in order to overcome their obstacles. This group will be process-oriented, with patients participating in exploration of their own experiences as well as giving and receiving support and challenge from other group members.  Therapeutic Goals: 1. Patient will identify personal and current obstacles as they relate to admission. 2. Patient will identify barriers that currently interfere with their wellness or overcoming obstacles.  3. Patient will identify feelings, thought process and behaviors related to these barriers. 4. Patient will identify two changes they are willing to make to overcome these obstacles:    Summary of Patient Progress Patient was present for the entirety of the group. He endorsed a goal of staying away from temptation. Pt identified not doing things he should not as an action step that he needs to take.  Therapeutic Modalities:   Cognitive Behavioral Therapy Solution Focused Therapy Motivational Interviewing Relapse Prevention Therapy   Glenis Smoker, LCSW

## 2021-08-13 NOTE — Progress Notes (Signed)
[facility] MD Progress Note  08/13/2021 12:57 PM Robert Holmes  MRN:  [id number] Subjective: Follow-up for this 40 year old man with bipolar disorder mixed or depressed.  Patient seen and chart reviewed.  Patient continues to endorse paranoia and inappropriate fear that he must go to jail for having broken into his mother's house.  Story is rambling and a little disorganized and hard to follow.  Patient stays withdrawn with a frightened and nervous affect about him.  Still having hallucinations.  No acute suicidal intent. Principal Problem: Bipolar 1 disorder (HCC) Diagnosis: Principal Problem:   Bipolar 1 disorder (HCC)  Total Time spent with patient: 30 minutes  Past Psychiatric History: Past history of bipolar disorder managed with antipsychotics and mood stabilizers in the past.  Not clear if he has been having any outpatient treatment recently.  He is unable to provide a very reliable history.  Past Medical History:  Past Medical History:  Diagnosis Date   Bipolar disorder (HCC)    Depression    Panic attack     Past Surgical History:  Procedure Laterality Date   TONSILLECTOMY     Family History:  Family History  Problem Relation Age of Onset   Anxiety disorder Mother    Depression Mother    Panic disorder Mother    Hypertension Father    Stroke Father    Diabetes Paternal Grandfather    Family Psychiatric  History: None reported Social History:  Social History   Substance and Sexual Activity  Alcohol Use No   Alcohol/week: 0.0 standard drinks     Social History   Substance and Sexual Activity  Drug Use No    Social History   Socioeconomic History   Marital status: Married    Spouse name: Not on file   Number of children: Not on file   Years of education: Not on file   Highest education level: Not on file  Occupational History   Not on file  Tobacco Use   Smoking status: Former    Types: Cigarettes    Start date: 07/05/1996    Quit date: 09/23/2014     Years since quitting: 6.8   Smokeless tobacco: Former    Types: Snuff, Dorna Bloom    Quit date: 07/05/1998  Vaping Use   Vaping Use: Never used  Substance and Sexual Activity   Alcohol use: No    Alcohol/week: 0.0 standard drinks   Drug use: No   Sexual activity: Not Currently  Other Topics Concern   Not on file  Social History Narrative   Not on file   Social Determinants of Health   Financial Resource Strain: Not on file  Food Insecurity: Not on file  Transportation Needs: Not on file  Physical Activity: Not on file  Stress: Not on file  Social Connections: Not on file   Additional Social History:                         Sleep: Fair  Appetite:  Fair  Current Medications: Current Facility-Administered Medications  Medication Dose Route Frequency Provider Last Rate Last Admin   acetaminophen (TYLENOL) tablet 650 mg  650 mg Oral Q6H PRN Dixon, Rashaun M, NP       alum & mag hydroxide-simeth (MAALOX/MYLANTA) 200-200-20 MG/5ML suspension 30 mL  30 mL Oral Q4H PRN Dixon, Rashaun M, NP       benztropine (COGENTIN) tablet 0.5 mg  0.5 mg Oral BID Emmogene Simson, Jackquline Denmark, MD  gabapentin (NEURONTIN) capsule 100 mg  100 mg Oral TID Jearld Lesch, NP   100 mg at 08/13/21 0743   hydrOXYzine (ATARAX/VISTARIL) tablet 25 mg  25 mg Oral TID PRN Jearld Lesch, NP   25 mg at 08/12/21 0900   lithium carbonate (ESKALITH) CR tablet 450 mg  450 mg Oral Q12H Debrah Granderson T, MD       magnesium hydroxide (MILK OF MAGNESIA) suspension 30 mL  30 mL Oral Daily PRN Durwin Nora, Rashaun M, NP       risperiDONE (RISPERDAL) tablet 2 mg  2 mg Oral BID Brittanyann Wittner, Jackquline Denmark, MD       traZODone (DESYREL) tablet 50 mg  50 mg Oral QHS PRN Jearld Lesch, NP   50 mg at 08/11/21 2141    Lab Results:  Results for orders placed or performed during the hospital encounter of 08/11/21 (from the past 48 hour(s))  Lipid panel     Status: Abnormal   Collection Time: 08/12/21  5:31 AM  Result Value Ref Range    Cholesterol 169 0 - 200 mg/dL   Triglycerides 72 <681 mg/dL   HDL 34 (L) >15 mg/dL   Total CHOL/HDL Ratio 5.0 RATIO   VLDL 14 0 - 40 mg/dL   LDL Cholesterol 726 (H) 0 - 99 mg/dL    Comment:        Total Cholesterol/HDL:CHD Risk Coronary Heart Disease Risk Table                     Men   Women  1/2 Average Risk   3.4   3.3  Average Risk       5.0   4.4  2 X Average Risk   9.6   7.1  3 X Average Risk  23.4   11.0        Use the calculated Patient Ratio above and the CHD Risk Table to determine the patient's CHD Risk.        ATP Holmes CLASSIFICATION (LDL):  <100     mg/dL   Optimal  203-559  mg/dL   Near or Above                    Optimal  130-159  mg/dL   Borderline  741-638  mg/dL   High  >453     mg/dL   Very High Performed at [facility], [address] [place], [place] [address]   Hemoglobin A1c     Status: None   Collection Time: 08/12/21  5:31 AM  Result Value Ref Range   Hgb A1c MFr Bld 5.0 4.8 - 5.6 %    Comment: (NOTE) Pre diabetes:          5.7%-6.4%  Diabetes:              >6.4%  Glycemic control for   <7.0% adults with diabetes    Mean Plasma Glucose 96.8 mg/dL    Comment: Performed at [facility], [address], [place], [place] [address]  Potassium     Status: None   Collection Time: 08/12/21  5:31 AM  Result Value Ref Range   Potassium 3.9 3.5 - 5.1 mmol/L    Comment: Performed at [facility], [address], [place], [place] [address]    Blood Alcohol level:  Lab Results  Component Value Date   [facility] <10 08/10/2021   ETH <10 06/26/2020    Metabolic Disorder  Labs: Lab Results  Component Value Date   HGBA1C 5.0 08/12/2021   MPG 96.8 08/12/2021   No results found for: PROLACTIN Lab Results  Component Value Date   CHOL 169 08/12/2021   TRIG 72 08/12/2021   HDL 34 (L) 08/12/2021   CHOLHDL 5.0 08/12/2021   VLDL 14 08/12/2021   LDLCALC 121 (H) 08/12/2021   LDLCALC 96 08/09/2014    Physical Findings: AIMS:  ,  ,  ,  ,    CIWA:    COWS:     Musculoskeletal: Strength & Muscle Tone: within normal limits Gait & Station: normal Patient leans: N/A  Psychiatric Specialty Exam:  Presentation  General Appearance: No data recorded Eye Contact:No data recorded Speech:No data recorded Speech Volume:No data recorded Handedness:No data recorded  Mood and Affect  Mood:No data recorded Affect:No data recorded  Thought Process  Thought Processes:No data recorded Descriptions of Associations:No data recorded Orientation:No data recorded Thought Content:No data recorded History of Schizophrenia/Schizoaffective disorder:No  Duration of Psychotic Symptoms:Less than six months  Hallucinations:No data recorded Ideas of Reference:No data recorded Suicidal Thoughts:No data recorded Homicidal Thoughts:No data recorded  Sensorium  Memory:No data recorded Judgment:No data recorded Insight:No data recorded  Executive Functions  Concentration:No data recorded Attention Span:No data recorded Recall:No data recorded Fund of Knowledge:No data recorded Language:No data recorded  Psychomotor Activity  Psychomotor Activity:No data recorded  Assets  Assets:No data recorded  Sleep  Sleep:No data recorded   Physical Exam: Physical Exam Vitals and nursing note reviewed.  Constitutional:      Appearance: Normal appearance.  HENT:     Head: Normocephalic and atraumatic.     Mouth/Throat:     Pharynx: Oropharynx is clear.  Eyes:     Pupils: Pupils are equal, round, and reactive to light.  Cardiovascular:     Rate and Rhythm: Normal rate and regular rhythm.  Pulmonary:     Effort: Pulmonary effort is normal.     Breath sounds: Normal breath sounds.  Abdominal:     General: Abdomen is flat.     Palpations: Abdomen is soft.  Musculoskeletal:        General: Normal range of motion.  Skin:    General: Skin is warm and dry.  Neurological:     General: No focal deficit present.     Mental  Status: He is alert. Mental status is at baseline.  Psychiatric:        Attention and Perception: He is inattentive.        Mood and Affect: Mood is anxious and depressed. Affect is blunt.        Speech: Speech is delayed and tangential.        Behavior: Behavior is slowed and withdrawn.        Thought Content: Thought content is paranoid.        Cognition and Memory: Memory is impaired.        Judgment: Judgment is impulsive.   Review of Systems  Constitutional: Negative.   HENT: Negative.    Eyes: Negative.   Respiratory: Negative.    Cardiovascular: Negative.   Gastrointestinal: Negative.   Musculoskeletal: Negative.   Skin: Negative.   Neurological: Negative.   Psychiatric/Behavioral:  Positive for depression and hallucinations. Negative for substance abuse and suicidal ideas. The patient is nervous/anxious.   Blood pressure 128/85, pulse 89, temperature 98.3 F (36.8 C), temperature source Oral, resp. rate 18, height 5\' 11"  (1.803 m), weight 95.7 kg, SpO2 98 %. Body mass index  is 29.43 kg/m.   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  He has tolerated lithium well and shown good response to it in the past and I would suggest that is a more effective and appropriate treatment for his psychotic depression than the Depakote.  Discontinue Depakote restart lithium 450 mg twice a day.  Patient advised of the plan.  Risperdal seems like a reasonable antipsychotic but I am going to increase the dose to 2 mg twice a day and add half milligram of Cogentin with each dose as a prophylactic against extrapyramidal symptoms.  Continue daily monitoring and encourage group attendance.  Mordecai Rasmussen, MD 08/13/2021, 12:57 PM

## 2021-08-13 NOTE — Progress Notes (Signed)
Patient has been isolative to his room. He did come out to receive his snack and medication. He has minimal contact with both staff and others on the unit. He denies si/hi/avh and pain at this encounter.  He does continue to endorse slight anxiety and depression rates both at 1/10 this evening.  He is med compliant this evening and received his QHS medication without incident.  Encouraged him to seek staff with any concerns.  Will continue to monitor with Q 15 minute safety checks.    Cleo Butler-Nicholson,LPN

## 2021-08-13 NOTE — Progress Notes (Signed)
Patient is isolative to his room, but periodically comes to the nurses station to see what time it is. Also attempts to talk about things in his past that he believes will land him in jail.  Explains that he has broken into his mother's home and stolen items.  He reports that he admitted to his mother that he broke into her home, but she does not know what he stole. He believes this and other cries that he has committed will eventually land him in jail. He is hyper focused and preoccupied with these thoughts.  He denies si/hi/anxiety and pain at this encounter.  He does endorse AH and depression at this encounter. He received his QHS med without incident.  Will continue to monitor with Q 15 minute safety rounds.     Cleo Butler-Nicholson, LPN

## 2021-08-13 NOTE — Progress Notes (Signed)
Recreation Therapy Notes   Date: 08/13/2021  Time: 10:30 am    Location: Craft room   Behavioral response: N/A   Intervention Topic: Goals    Discussion/Intervention: Patient did not attend group.   Clinical Observations/Feedback:  Patient did not attend group.   Oluwadamilola Rosamond LRT/CTRS        Rhyland Hinderliter 08/13/2021 1:02 PM

## 2021-08-13 NOTE — Progress Notes (Signed)
Denies SI/HI/VH. Endorses auditory hallucinations. Says voices are talking to him about what he did.States, 'I need to be in jail because of what I did." Patient instructed that someone will come and talk with him to help him process his feelings. Rates depression 10/10. Denies anxiety and pain.   Remains isolative this shift. Has to be promoted for meals and medications. Med compliant otherwise. Continues to be fixated on going to jail despite redirection.   Cont Q 15 minute check for safety.

## 2021-08-13 NOTE — Plan of Care (Signed)
  Problem: Education: Goal: Emotional status will improve Outcome: Progressing Goal: Mental status will improve Outcome: Progressing   Problem: Coping: Goal: Ability to demonstrate self-control will improve Outcome: Progressing   Problem: Health Behavior/Discharge Planning: Goal: Compliance with prescribed medication regimen will improve Outcome: Progressing

## 2021-08-13 NOTE — Plan of Care (Signed)
See nursing note for details.  Problem: Education: Goal: Emotional status will improve Outcome: Not Progressing Goal: Mental status will improve Outcome: Not Progressing Goal: Verbalization of understanding the information provided will improve Outcome: Not Progressing   Problem: Activity: Goal: Interest or engagement in activities will improve Outcome: Not Progressing Goal: Sleeping patterns will improve Outcome: Not Progressing   Problem: Coping: Goal: Ability to verbalize frustrations and anger appropriately will improve Outcome: Not Progressing Goal: Ability to demonstrate self-control will improve Outcome: Not Progressing

## 2021-08-13 NOTE — Plan of Care (Signed)
  Problem: Education: Goal: Emotional status will improve Outcome: Progressing   Problem: Activity: Goal: Sleeping patterns will improve Outcome: Progressing   Problem: Coping: Goal: Ability to demonstrate self-control will improve Outcome: Progressing   Problem: Health Behavior/Discharge Planning: Goal: Compliance with treatment plan for underlying cause of condition will improve Outcome: Progressing   Problem: Health Behavior/Discharge Planning: Goal: Compliance with prescribed medication regimen will improve Outcome: Progressing   Problem: Nutritional: Goal: Ability to achieve adequate nutritional intake will improve Outcome: Progressing

## 2021-08-13 NOTE — Progress Notes (Signed)
   08/13/21 1040  Clinical Encounter Type  Visited With Patient  Visit Type Initial;Spiritual support;Social support  Referral From Nurse  Consult/Referral To Chaplain  Spiritual Encounters  Spiritual Needs Emotional  Chaplain Sophronia Simas responded to a consult request for pastoral care. Chaplain Burris provided compassionate presence and active and reflective listening. Chaplain attempted to gain insight into Pt's perspective and reasons for feelings of guilty and sources of perceived concern. Chaplain offered supportive presence and offered to continue our conversation. Will continue to follow.

## 2021-08-14 NOTE — Progress Notes (Signed)
Recreation Therapy Notes   Date: 08/14/2021  Time: 10:00 am    Location: Craft room  Behavioral response: Appropriate  Intervention Topic: Problem Solving   Discussion/Intervention:  Group content on today was focused on problem solving. The group described what problem solving is. Patients expressed how problems affect them and how they deal with problems. Individuals identified healthy ways to deal with problems. Patients explained what normally happens to them when they do not deal with problems. The group expressed reoccurring problems for them. The group participated in the intervention "Ways to Solve problems" where patients were given a chance to explore different ways to solve problems.  Clinical Observations/Feedback: Patient came to group and stated that he solves problems by using resources, asking for help and praying. Participant expressed that it is important to stay calm when trying to solve problems so you can make good decisions. Individual was social with peers and staff while participating in the intervention.  Loyal Rudy LRT/CTRS         [place] Kirchner 08/14/2021 11:17 AM

## 2021-08-14 NOTE — Plan of Care (Signed)
See progress note Problem: Education: Goal: Knowledge of [facility] Education information/materials will improve Outcome: Progressing Goal: Emotional status will improve Outcome: Progressing Goal: Mental status will improve Outcome: Progressing Goal: Verbalization of understanding the information provided will improve Outcome: Progressing   Problem: Activity: Goal: Interest or engagement in activities will improve Outcome: Progressing Goal: Sleeping patterns will improve Outcome: Progressing   Problem: Coping: Goal: Ability to verbalize frustrations and anger appropriately will improve Outcome: Progressing Goal: Ability to demonstrate self-control will improve Outcome: Progressing   Problem: Health Behavior/Discharge Planning: Goal: Identification of resources available to assist in meeting health care needs will improve Outcome: Progressing Goal: Compliance with treatment plan for underlying cause of condition will improve Outcome: Progressing   Problem: Physical Regulation: Goal: Ability to maintain clinical measurements within normal limits will improve Outcome: Progressing   Problem: Safety: Goal: Periods of time without injury will increase Outcome: Progressing   Problem: Activity: Goal: Will verbalize the importance of balancing activity with adequate rest periods Outcome: Progressing   Problem: Education: Goal: Will be free of psychotic symptoms Outcome: Progressing Goal: Knowledge of the prescribed therapeutic regimen will improve Outcome: Progressing   Problem: Coping: Goal: Coping ability will improve Outcome: Progressing Goal: Will verbalize feelings Outcome: Progressing   Problem: Health Behavior/Discharge Planning: Goal: Compliance with prescribed medication regimen will improve Outcome: Progressing   Problem: Nutritional: Goal: Ability to achieve adequate nutritional intake will improve Outcome: Progressing   Problem: Role  Relationship: Goal: Ability to communicate needs accurately will improve Outcome: Progressing Goal: Ability to interact with others will improve Outcome: Progressing   Problem: Safety: Goal: Ability to redirect hostility and anger into socially appropriate behaviors will improve Outcome: Progressing Goal: Ability to remain free from injury will improve Outcome: Progressing   Problem: Self-Care: Goal: Ability to participate in self-care as condition permits will improve Outcome: Progressing   Problem: Self-Concept: Goal: Will verbalize positive feelings about self Outcome: Progressing   

## 2021-08-14 NOTE — Progress Notes (Signed)
Recreation Therapy Notes  INPATIENT RECREATION THERAPY ASSESSMENT  Patient Details Name: BRENNYN HAISLEY MRN: [id number] DOB: 1981/03/26 Today's Date: 08/14/2021       Information Obtained From: Patient  Able to Participate in Assessment/Interview: Yes  Patient Presentation: Responsive  Reason for Admission (Per Patient): Active Symptoms  Patient Stressors:    Coping Skills:   Film/video editor, Music, Exercise, Avoidance  Leisure Interests (2+):  Exercise - Paediatric nurse, Music - Play instrument, Music - Listen, Social - Family, Community - Engineer, civil (consulting), Individual - TV  Frequency of Recreation/Participation: Chief Executive Officer of Community Resources:  Yes  Community Resources:  [place], [employer]  Current Use: Yes  If no, Barriers?: Other (Comment) (Mother)  Expressed Interest in [employer] Information: Yes  County of Residence:  [place]  Patient Main Form of Transportation: Other (Comment) (Mother)  Patient Strengths:  Hard worker  Patient Identified Areas of Improvement:  Be more social  Patient Goal for Hospitalization:  get medication right and get back to work  Current SI (including self-harm):  No  Current HI:  No  Current AVH: No  Staff Intervention Plan: Group Attendance, Collaborate with Interdisciplinary Treatment Team  Consent to Intern Participation: N/A  Benita Boonstra 08/14/2021, 4:05 PM

## 2021-08-14 NOTE — Progress Notes (Signed)
[facility] MD Progress Note  08/14/2021 3:31 PM Robert Holmes  MRN:  [id number] Subjective: Follow-up for this 40 year old man with bipolar disorder.  Patient today tells me that he is "all better".  He says that his symptoms are entirely improved.  He denies having any paranoia or fear.  Denies suicidal or homicidal thought.  Patient's affect still looks nervous and frightened with wrinkled brow and a almost despairing look on his face.  Tolerating medicine it appears.  No side effects reported.  No behavior problems Principal Problem: Bipolar 1 disorder (HCC) Diagnosis: Principal Problem:   Bipolar 1 disorder (HCC)  Total Time spent with patient: 30 minutes  Past Psychiatric History: Past history of bipolar disorder with depressive and manic spells  Past Medical History:  Past Medical History:  Diagnosis Date   Bipolar disorder (HCC)    Depression    Panic attack     Past Surgical History:  Procedure Laterality Date   TONSILLECTOMY     Family History:  Family History  Problem Relation Age of Onset   Anxiety disorder Mother    Depression Mother    Panic disorder Mother    Hypertension Father    Stroke Father    Diabetes Paternal Grandfather    Family Psychiatric  History: See previous Social History:  Social History   Substance and Sexual Activity  Alcohol Use No   Alcohol/week: 0.0 standard drinks     Social History   Substance and Sexual Activity  Drug Use No    Social History   Socioeconomic History   Marital status: Married    Spouse name: Not on file   Number of children: Not on file   Years of education: Not on file   Highest education level: Not on file  Occupational History   Not on file  Tobacco Use   Smoking status: Former    Types: Cigarettes    Start date: 07/05/1996    Quit date: 09/23/2014    Years since quitting: 6.8   Smokeless tobacco: Former    Types: Snuff, Dorna Bloom    Quit date: 07/05/1998  Vaping Use   Vaping Use: Never used   Substance and Sexual Activity   Alcohol use: No    Alcohol/week: 0.0 standard drinks   Drug use: No   Sexual activity: Not Currently  Other Topics Concern   Not on file  Social History Narrative   Not on file   Social Determinants of Health   Financial Resource Strain: Not on file  Food Insecurity: Not on file  Transportation Needs: Not on file  Physical Activity: Not on file  Stress: Not on file  Social Connections: Not on file   Additional Social History:                         Sleep: Fair  Appetite:  Fair  Current Medications: Current Facility-Administered Medications  Medication Dose Route Frequency Provider Last Rate Last Admin   acetaminophen (TYLENOL) tablet 650 mg  650 mg Oral Q6H PRN Jearld Lesch, NP       alum & mag hydroxide-simeth (MAALOX/MYLANTA) 200-200-20 MG/5ML suspension 30 mL  30 mL Oral Q4H PRN Dixon, Rashaun M, NP       benztropine (COGENTIN) tablet 0.5 mg  0.5 mg Oral BID Marielouise Amey T, MD   0.5 mg at 08/14/21 0801   gabapentin (NEURONTIN) capsule 100 mg  100 mg Oral TID Jearld Lesch, NP  100 mg at 08/14/21 1309   hydrOXYzine (ATARAX/VISTARIL) tablet 25 mg  25 mg Oral TID PRN Jearld Lesch, NP   25 mg at 08/12/21 0900   lithium carbonate (ESKALITH) CR tablet 450 mg  450 mg Oral Q12H Hobert Poplaski T, MD   450 mg at 08/14/21 0801   magnesium hydroxide (MILK OF MAGNESIA) suspension 30 mL  30 mL Oral Daily PRN Jearld Lesch, NP       risperiDONE (RISPERDAL) tablet 2 mg  2 mg Oral BID Khamille Beynon T, MD   2 mg at 08/14/21 0801   traZODone (DESYREL) tablet 50 mg  50 mg Oral QHS PRN Jearld Lesch, NP   50 mg at 08/11/21 2141    Lab Results: No results found for this or any previous visit (from the past 48 hour(s)).  Blood Alcohol level:  Lab Results  Component Value Date   ETH <10 08/10/2021   ETH <10 06/26/2020    Metabolic Disorder Labs: Lab Results  Component Value Date   HGBA1C 5.0 08/12/2021   MPG 96.8  08/12/2021   No results found for: PROLACTIN Lab Results  Component Value Date   CHOL 169 08/12/2021   TRIG 72 08/12/2021   HDL 34 (L) 08/12/2021   CHOLHDL 5.0 08/12/2021   VLDL 14 08/12/2021   LDLCALC 121 (H) 08/12/2021   LDLCALC 96 08/09/2014    Physical Findings: AIMS:  , ,  ,  ,    CIWA:    COWS:     Musculoskeletal: Strength & Muscle Tone: within normal limits Gait & Station: normal Patient leans: N/A  Psychiatric Specialty Exam:  Presentation  General Appearance: No data recorded Eye Contact:No data recorded Speech:No data recorded Speech Volume:No data recorded Handedness:No data recorded  Mood and Affect  Mood:No data recorded Affect:No data recorded  Thought Process  Thought Processes:No data recorded Descriptions of Associations:No data recorded Orientation:No data recorded Thought Content:No data recorded History of Schizophrenia/Schizoaffective disorder:No  Duration of Psychotic Symptoms:Less than six months  Hallucinations:No data recorded Ideas of Reference:No data recorded Suicidal Thoughts:No data recorded Homicidal Thoughts:No data recorded  Sensorium  Memory:No data recorded Judgment:No data recorded Insight:No data recorded  Executive Functions  Concentration:No data recorded Attention Span:No data recorded Recall:No data recorded Fund of Knowledge:No data recorded Language:No data recorded  Psychomotor Activity  Psychomotor Activity:No data recorded  Assets  Assets:No data recorded  Sleep  Sleep:No data recorded   Physical Exam: Physical Exam Vitals and nursing note reviewed.  Constitutional:      Appearance: Normal appearance.  HENT:     Head: Normocephalic and atraumatic.     Mouth/Throat:     Pharynx: Oropharynx is clear.  Eyes:     Pupils: Pupils are equal, round, and reactive to light.  Cardiovascular:     Rate and Rhythm: Normal rate and regular rhythm.  Pulmonary:     Effort: Pulmonary effort is normal.      Breath sounds: Normal breath sounds.  Abdominal:     General: Abdomen is flat.     Palpations: Abdomen is soft.  Musculoskeletal:        General: Normal range of motion.  Skin:    General: Skin is warm and dry.  Neurological:     General: No focal deficit present.     Mental Status: He is alert. Mental status is at baseline.  Psychiatric:        Attention and Perception: Attention normal.  Mood and Affect: Mood normal. Affect is blunt.        Speech: Speech is delayed.        Behavior: Behavior is slowed.        Thought Content: Thought content normal. Thought content does not include suicidal ideation.        Cognition and Memory: Memory is impaired.   Review of Systems  Constitutional: Negative.   HENT: Negative.    Eyes: Negative.   Respiratory: Negative.    Cardiovascular: Negative.   Gastrointestinal: Negative.   Musculoskeletal: Negative.   Skin: Negative.   Neurological: Negative.   Psychiatric/Behavioral: Negative.    Blood pressure (!) 138/96, pulse 86, temperature 97.9 F (36.6 C), temperature source Oral, resp. rate 18, height 5\' 11"  (1.803 m), weight 95.7 kg, SpO2 99 %. Body mass index is 29.43 kg/m.   Treatment Plan Summary: Plan no change to current medication dosage.  Supportive counseling but I am concerned that he may be exaggerating his improvement.  Continue monitoring and engagement in groups.  , MD 08/14/2021, 3:31 PM

## 2021-08-14 NOTE — Progress Notes (Signed)
Recreation Therapy Notes  INPATIENT RECREATION THERAPY ASSESSMENT  Patient Details Name: Robert Holmes MRN: [id number] DOB: Apr 08, 1981 Today's Date: 08/14/2021       Information Obtained From: Patient  Able to Participate in Assessment/Interview: Yes  Patient Presentation: Responsive  Reason for Admission (Per Patient): Active Symptoms  Patient Stressors:    Coping Skills:   Film/video editor, Music, Exercise, Avoidance  Leisure Interests (2+):  Exercise - Paediatric nurse, Music - Play instrument, Music - Listen, Social - Family, Community - Engineer, civil (consulting), Individual - TV  Frequency of Recreation/Participation: Chief Executive Officer of Community Resources:  Yes  Community Resources:  [place], [employer]  Current Use: Yes  If no, Barriers?: Other (Comment) (Mother)  Expressed Interest in [employer] Information: Yes  County of Residence[place]  Patient Main Form of Transportation: Other (Comment) (Mother)  Patient Strengths:  Hard worker  Patient Identified Areas of Improvement:  Be more social  Patient Goal for Hospitalization:  get medication right and get back to work  Current SI (including self-harm):  No  Current HI:  No  Current AVH: No  Staff Intervention Plan: Group Attendance, Collaborate with Interdisciplinary Treatment Team  Consent to Intern Participation: N/A  Robert Holmes 08/14/2021, 4:06 PM

## 2021-08-14 NOTE — Group Note (Signed)
[facility] LCSW Group Therapy Note   Group Date: 08/14/2021 Start Time: 1300 End Time: 1400  Type of Therapy/Topic:  Group Therapy:  Feelings about Diagnosis  Participation Level:  Did Not Attend   Description of Group:    This group will allow patients to explore their thoughts and feelings about diagnoses they have received. Patients will be guided to explore their level of understanding and acceptance of these diagnoses. Facilitator will encourage patients to process their thoughts and feelings about the reactions of others to their diagnosis, and will guide patients in identifying ways to discuss their diagnosis with significant others in their lives. This group will be process-oriented, with patients participating in exploration of their own experiences as well as giving and receiving support and challenge from other group members.   Therapeutic Goals: 1. Patient will demonstrate understanding of diagnosis as evidence by identifying two or more symptoms of the disorder:  2. Patient will be able to express two feelings regarding the diagnosis 3. Patient will demonstrate ability to communicate their needs through discussion and/or role plays  Summary of Patient Progress: Patient given the opportunity to attend group, however, declined to attend.    Therapeutic Modalities:   Cognitive Behavioral Therapy Brief Therapy Feelings Identification    Harden Mo, LCSW

## 2021-08-14 NOTE — Progress Notes (Signed)
Pt visible on the unit  minimal interaction with peers and staff. Easy to engage, affect sad and depressed. He denies SI/HI and A/V hallucinations. He stated he is anxious that he might lose his job, if he is not discharged soon. He stated, "I am anxious because my kids want stuff all the time. I need a job to pay child support and I been divorced." He stated his goal is to clear his mind and not to think about anything. He states he was admitted, because he was overwhelmed about his family, finances and his thoughts."

## 2021-08-14 NOTE — Progress Notes (Signed)
Recreation Therapy Notes  INPATIENT RECREATION TR PLAN  Patient Details Name: Robert Holmes MRN: [id number] DOB: Jul 19, 1981 Today's Date: 08/14/2021  Rec Therapy Plan Is patient appropriate for Therapeutic Recreation?: Yes Treatment times per week: at least 3 Estimated Length of Stay: 5-7 days TR Treatment/Interventions: Group participation (Comment)  Discharge Criteria Pt will be discharged from therapy if:: Discharged Treatment plan/goals/alternatives discussed and agreed upon by:: Patient/family  Discharge Summary     [name] 08/14/2021, 4:06 PM

## 2021-08-15 MED ORDER — RISPERIDONE 2 MG PO TABS: 2.0000 mg | ORAL_TABLET | Freq: Two times a day (BID) | ORAL | 1 refills | Status: AC

## 2021-08-15 MED ORDER — GABAPENTIN 100 MG PO CAPS: 100.0000 mg | ORAL_CAPSULE | Freq: Three times a day (TID) | ORAL | 1 refills | Status: AC

## 2021-08-15 MED ORDER — LITHIUM CARBONATE ER 450 MG PO TBCR: 450.0000 mg | EXTENDED_RELEASE_TABLET | Freq: Two times a day (BID) | ORAL | 1 refills | Status: AC

## 2021-08-15 MED ORDER — BENZTROPINE MESYLATE 0.5 MG PO TABS
0.5000 mg | ORAL_TABLET | Freq: Two times a day (BID) | ORAL | 1 refills | Status: AC
Start: 2021-08-15 — End: ?

## 2021-08-15 MED ORDER — HYDROXYZINE HCL 25 MG PO TABS: 25.0000 mg | ORAL_TABLET | Freq: Three times a day (TID) | ORAL | 1 refills | Status: AC | PRN

## 2021-08-15 NOTE — Group Note (Signed)
[facility] LCSW Group Therapy Note   Group Date: 08/15/2021 Start Time: 1310 End Time: 1400   Type of Therapy/Topic:  Group Therapy:  Emotion Regulation  Participation Level:  Active   Mood:  Description of Group:    The purpose of this group is to assist patients in learning to regulate negative emotions and experience positive emotions. Patients will be guided to discuss ways in which they have been vulnerable to their negative emotions. These vulnerabilities will be juxtaposed with experiences of positive emotions or situations, and patients challenged to use positive emotions to combat negative ones. Special emphasis will be placed on coping with negative emotions in conflict situations, and patients will process healthy conflict resolution skills.  Therapeutic Goals: Patient will identify two positive emotions or experiences to reflect on in order to balance out negative emotions:  Patient will label two or more emotions that they find the most difficult to experience:  Patient will be able to demonstrate positive conflict resolution skills through discussion or role plays:   Summary of Patient Progress: Patient was present for the entirety of group. He was involved in the discussion. Patient identified regret and shame as strong emotions that he needs to deal with. He was also verbal during the discussion of physical signs indicating strong emotions. Patient appeared open and receptive to feedback and comments from peers.   Therapeutic Modalities:   Cognitive Behavioral Therapy Feelings Identification Dialectical Behavioral Therapy   Glenis Smoker, LCSW

## 2021-08-15 NOTE — BHH Suicide Risk Assessment (Signed)
[facility] Discharge Suicide Risk Assessment   Principal Problem: Bipolar 1 disorder (HCC) Discharge Diagnoses: Principal Problem:   Bipolar 1 disorder (HCC)   Total Time spent with patient: 45 minutes  Musculoskeletal: Strength & Muscle Tone: within normal limits Gait & Station: normal Patient leans: N/A  Psychiatric Specialty Exam  Presentation  General Appearance: No data recorded Eye Contact:No data recorded Speech:No data recorded Speech Volume:No data recorded Handedness:No data recorded  Mood and Affect  Mood:No data recorded Duration of Depression Symptoms: Less than two weeks  Affect:No data recorded  Thought Process  Thought Processes:No data recorded Descriptions of Associations:No data recorded Orientation:No data recorded Thought Content:No data recorded History of Schizophrenia/Schizoaffective disorder:No  Duration of Psychotic Symptoms:Less than six months  Hallucinations:No data recorded Ideas of Reference:No data recorded Suicidal Thoughts:No data recorded Homicidal Thoughts:No data recorded  Sensorium  Memory:No data recorded Judgment:No data recorded Insight:No data recorded  Executive Functions  Concentration:No data recorded Attention Span:No data recorded Recall:No data recorded Fund of Knowledge:No data recorded Language:No data recorded  Psychomotor Activity  Psychomotor Activity:No data recorded  Assets  Assets:No data recorded  Sleep  Sleep:No data recorded  Physical Exam: Physical Exam Constitutional:      Appearance: Normal appearance.  HENT:     Head: Normocephalic and atraumatic.     Mouth/Throat:     Pharynx: Oropharynx is clear.  Eyes:     Pupils: Pupils are equal, round, and reactive to light.  Cardiovascular:     Rate and Rhythm: Normal rate and regular rhythm.  Pulmonary:     Effort: Pulmonary effort is normal.     Breath sounds: Normal breath sounds.  Abdominal:     General: Abdomen is flat.     Palpations:  Abdomen is soft.  Musculoskeletal:        General: Normal range of motion.  Skin:    General: Skin is warm and dry.  Neurological:     General: No focal deficit present.     Mental Status: He is alert. Mental status is at baseline.  Psychiatric:        Mood and Affect: Mood normal.        Thought Content: Thought content normal.   Review of Systems  Constitutional: Negative.   HENT: Negative.    Eyes: Negative.   Respiratory: Negative.    Cardiovascular: Negative.   Gastrointestinal: Negative.   Musculoskeletal: Negative.   Skin: Negative.   Neurological: Negative.   Psychiatric/Behavioral: Negative.    Blood pressure 112/75, pulse 77, temperature 98 F (36.7 C), temperature source Oral, resp. rate 18, height 5\' 11"  (1.803 m), weight 95.7 kg, SpO2 100 %. Body mass index is 29.43 kg/m.  Mental Status Per Nursing Assessment::   On Admission:  NA  Demographic Factors:  Male and Living alone  Loss Factors: Financial problems/change in socioeconomic status  Historical Factors: Impulsivity  Risk Reduction Factors:   Positive social support  Continued Clinical Symptoms:  Bipolar Disorder:   Mixed State  Cognitive Features That Contribute To Risk:  Polarized thinking    Suicide Risk:  Minimal: No identifiable suicidal ideation.  Patients presenting with no risk factors but with morbid ruminations; may be classified as minimal risk based on the severity of the depressive symptoms    Plan Of Care/Follow-up recommendations:  Continue current medication and follow up with outpatient provider  [phone], MD 08/15/2021, 12:41 PM

## 2021-08-15 NOTE — Progress Notes (Signed)
  [facility] Adult Case Management Discharge Plan :  Will you be returning to the same living situation after discharge:  Yes,  pt reports that he is returning home  At discharge, do you have transportation home?: Yes,  pt reports that his mother will pick him up.  Do you have the ability to pay for your medications: Yes,  [employer]  Release of information consent forms completed and in the chart;  Patient's signature needed at discharge.  Patient to Follow up at:  Follow-up Information     [facility].   Why: Appointment is scheduled for 08/28/2021 at 11AM.  Thanks!  Please bring ID, insurance card and medications to the appointments. Contact information: [address] [place] [address] [phone]                 Next level of care provider has access to [facility] Link:no  Safety Planning and Suicide Prevention discussed: Yes,  SPE completed with the patient.      Has patient been referred to the Quitline?: Patient refused referral  Patient has been referred for addiction treatment: Pt. refused referral  Harden Mo, LCSW 08/15/2021, 1:44 PM

## 2021-08-15 NOTE — Progress Notes (Signed)
Recreation Therapy Notes  INPATIENT RECREATION TR PLAN  Patient Details Name: Robert Holmes MRN: [id number] DOB: 03/01/1981 Today's Date: 08/15/2021  Rec Therapy Plan Is patient appropriate for Therapeutic Recreation?: Yes Treatment times per week: at least 3 Estimated Length of Stay: 5-7 days TR Treatment/Interventions: Group participation (Comment)  Discharge Criteria Pt will be discharged from therapy if:: Discharged Treatment plan/goals/alternatives discussed and agreed upon by:: Patient/family  Discharge Summary Short term goals set: Patient will identify 3 positive coping skills strategies to use post d/c within 5 recreation therapy group sessions Short term goals met: Adequate for discharge Progress toward goals comments: Groups attended Which groups?: Other (Comment) (Time Management, Problem Solving) Reason goals not met: N/A Therapeutic equipment acquired: N/A Reason patient discharged from therapy: Discharge from hospital Pt/family agrees with progress & goals achieved: Yes Date patient discharged from therapy: 08/15/21   Sally Reimers 08/15/2021, 3:11 PM

## 2021-08-15 NOTE — Progress Notes (Signed)
Patient ID: Robert Holmes, male   DOB: August 22, 1981, 40 y.o.   MRN: [id number]  Discharge Note:  Patient denies SI/HI/AVH at this time. Discharge instructions, AVS, prescriptions, and transition record gone over with patient. Patient agrees to comply with medication management, follow-up visit, and outpatient therapy. Patient belongings returned to patient. Patient questions and concerns addressed and answered. Patient ambulatory off unit. Patient discharged to home with his Mother.

## 2021-08-15 NOTE — Plan of Care (Signed)
D- Patient alert and oriented. Patient presents in a pleasant mood on assessment stating that he slept good last night and had no complaints to voice to this Clinical research associate. Patient denies SI, HI, AVH, and pain at this time. Patient also denies any signs/symptoms of depression and anxiety stating that he is just ready to go to his aunt's house to have Thanksgiving dinner. Patient's goal for today is "to get ready to go home", in which he will "talk to the doctor" in order to achieve his goal.  A- Scheduled medications administered to patient, per MD orders. Support and encouragement provided.  Routine safety checks conducted every 15 minutes.  Patient informed to notify staff with problems or concerns.  R- No adverse drug reactions noted. Patient contracts for safety at this time. Patient compliant with medications and treatment plan. Patient receptive, calm, and cooperative. Patient interacts well with others on the unit.  Patient remains safe at this time.  Problem: Education: Goal: Knowledge of [facility] Education information/materials will improve Outcome: Progressing Goal: Emotional status will improve Outcome: Progressing Goal: Mental status will improve Outcome: Progressing Goal: Verbalization of understanding the information provided will improve Outcome: Progressing   Problem: Activity: Goal: Interest or engagement in activities will improve Outcome: Progressing Goal: Sleeping patterns will improve Outcome: Progressing   Problem: Coping: Goal: Ability to verbalize frustrations and anger appropriately will improve Outcome: Progressing Goal: Ability to demonstrate self-control will improve Outcome: Progressing   Problem: Health Behavior/Discharge Planning: Goal: Identification of resources available to assist in meeting health care needs will improve Outcome: Progressing Goal: Compliance with treatment plan for underlying cause of condition will improve Outcome: Progressing    Problem: Physical Regulation: Goal: Ability to maintain clinical measurements within normal limits will improve Outcome: Progressing   Problem: Safety: Goal: Periods of time without injury will increase Outcome: Progressing   Problem: Activity: Goal: Will verbalize the importance of balancing activity with adequate rest periods Outcome: Progressing   Problem: Education: Goal: Will be free of psychotic symptoms Outcome: Progressing Goal: Knowledge of the prescribed therapeutic regimen will improve Outcome: Progressing   Problem: Coping: Goal: Coping ability will improve Outcome: Progressing Goal: Will verbalize feelings Outcome: Progressing   Problem: Health Behavior/Discharge Planning: Goal: Compliance with prescribed medication regimen will improve Outcome: Progressing   Problem: Nutritional: Goal: Ability to achieve adequate nutritional intake will improve Outcome: Progressing   Problem: Role Relationship: Goal: Ability to communicate needs accurately will improve Outcome: Progressing Goal: Ability to interact with others will improve Outcome: Progressing   Problem: Safety: Goal: Ability to redirect hostility and anger into socially appropriate behaviors will improve Outcome: Progressing Goal: Ability to remain free from injury will improve Outcome: Progressing   Problem: Self-Care: Goal: Ability to participate in self-care as condition permits will improve Outcome: Progressing   Problem: Self-Concept: Goal: Will verbalize positive feelings about self Outcome: Progressing

## 2021-08-15 NOTE — Plan of Care (Signed)
  Problem: Coping Skills Goal: STG - Patient will identify 3 positive coping skills strategies to use post d/c within 5 recreation therapy group sessions Description: STG - Patient will identify 3 positive coping skills strategies to use post d/c within 5 recreation therapy group sessions 08/15/2021 1510 by Alveria Apley, LRT Outcome: Adequate for Discharge 08/15/2021 1510 by Alveria Apley, LRT Outcome: Adequate for Discharge

## 2021-08-15 NOTE — Progress Notes (Signed)
Patient alert and oriented x 4, affect is flat, he appears slow to response, his thoughts are organized, but appears responding to internal stimuli he denies SI/HI/AVH no interacting with peers and staff, no distress noted, he was complaint with medication regimen 15 minutes safety checks maintained will continue to monitor

## 2021-08-15 NOTE — Progress Notes (Signed)
Recreation Therapy Notes   Date: 08/15/2021  Time: 10:30 am    Location: Craft room    Behavioral response: Appropriate  Intervention Topic: Time Management     Discussion/Intervention:  Group content today was focused on time management. The group defined time management and identified healthy ways to manage time. Individuals expressed how much of the 24 hours they use in a day. Patients expressed how much time they use just for themselves personally. The group expressed how they have managed their time in the past. Individuals participated in the intervention "Managing Life" where they had a chance to see how much of the 24 hours they use and where it goes. Clinical Observations/Feedback: Patient came to group and defined time management as getting things done in a time frame. He stated that he uses clocks, alarms, and watches to help him with time management. Participant explained that he manages his time to get to work on time and get things done around the house. Individual was social with staff and peers while participating in the intervention. Byrne Capek LRT/CTRS         Lee Kuang 08/15/2021 12:22 PM

## 2021-08-15 NOTE — Discharge Summary (Signed)
Physician Discharge Summary Note  Patient:  Robert Holmes is an 40 y.o., male MRN:  [id number] DOB:  [birth year] Patient phone:  [phone] (home)  Patient address:   [address] [place] [id number],  Total Time spent with patient: 45 minutes  Date of Admission:  08/11/2021 Date of Discharge: 08/15/2021  Reason for Admission: Admitted because of worsening mood symptoms with paranoia and psychosis  Principal Problem: Bipolar 1 disorder (HCC) Discharge Diagnoses: Principal Problem:   Bipolar 1 disorder (HCC)   Past Psychiatric History: History of bipolar disorder  Past Medical History:  Past Medical History:  Diagnosis Date   Bipolar disorder (HCC)    Depression    Panic attack     Past Surgical History:  Procedure Laterality Date   TONSILLECTOMY     Family History:  Family History  Problem Relation Age of Onset   Anxiety disorder Mother    Depression Mother    Panic disorder Mother    Hypertension Father    Stroke Father    Diabetes Paternal Grandfather    Family Psychiatric  History: None reported Social History:  Social History   Substance and Sexual Activity  Alcohol Use No   Alcohol/week: 0.0 standard drinks     Social History   Substance and Sexual Activity  Drug Use No    Social History   Socioeconomic History   Marital status: Married    Spouse name: Not on file   Number of children: Not on file   Years of education: Not on file   Highest education level: Not on file  Occupational History   Not on file  Tobacco Use   Smoking status: Former    Types: Cigarettes    Start date: 07/05/1996    Quit date: 09/23/2014    Years since quitting: 6.8   Smokeless tobacco: Former    Types: Snuff, Dorna BloomChew    Quit date: 07/05/1998  Vaping Use   Vaping Use: Never used  Substance and Sexual Activity   Alcohol use: No    Alcohol/week: 0.0 standard drinks   Drug use: No   Sexual activity: Not Currently  Other Topics Concern    Not on file  Social History Narrative   Not on file   Social Determinants of Health   Financial Resource Strain: Not on file  Food Insecurity: Not on file  Transportation Needs: Not on file  Physical Activity: Not on file  Stress: Not on file  Social Connections: Not on file    Hospital Course: Restarted on medication with lithium and Risperdal.  Patient was compliant with medicine and tolerated them well.  Did not report any side effects.  Reported mood felt much better.  Denied suicidal thoughts.  Interacting with others in a more normal fashion without any obvious paranoia.  Denies hallucinations denied paranoid ideation.  Shows insight and is agreeable to continuing medicine.  No evidence of current dangerousness.  Patient agrees to continue outpatient treatment with medication.  Prescriptions will be provided at discharge.  Physical Findings: AIMS:  , ,  ,  ,    CIWA:    COWS:     Musculoskeletal: Strength & Muscle Tone: within normal limits Gait & Station: normal Patient leans: N/A   Psychiatric Specialty Exam:  Presentation  General Appearance: No data recorded Eye Contact:No data recorded Speech:No data recorded Speech Volume:No data recorded Handedness:No data recorded  Mood and Affect  Mood:No data recorded Affect:No data recorded  Thought  Process  Thought Processes:No data recorded Descriptions of Associations:No data recorded Orientation:No data recorded Thought Content:No data recorded History of Schizophrenia/Schizoaffective disorder:No  Duration of Psychotic Symptoms:Less than six months  Hallucinations:No data recorded Ideas of Reference:No data recorded Suicidal Thoughts:No data recorded Homicidal Thoughts:No data recorded  Sensorium  Memory:No data recorded Judgment:No data recorded Insight:No data recorded  Executive Functions  Concentration:No data recorded Attention Span:No data recorded Recall:No data recorded [employer] recorded Language:No data recorded  Psychomotor Activity  Psychomotor Activity:No data recorded  Assets  Assets:No data recorded  Sleep  Sleep:No data recorded   Physical Exam: Physical Exam Vitals and nursing note reviewed.  Constitutional:      Appearance: Normal appearance.  HENT:     Head: Normocephalic and atraumatic.     Mouth/Throat:     Pharynx: Oropharynx is clear.  Eyes:     Pupils: Pupils are equal, round, and reactive to light.  Cardiovascular:     Rate and Rhythm: Normal rate and regular rhythm.  Pulmonary:     Effort: Pulmonary effort is normal.     Breath sounds: Normal breath sounds.  Abdominal:     General: Abdomen is flat.     Palpations: Abdomen is soft.  Musculoskeletal:        General: Normal range of motion.  Skin:    General: Skin is warm and dry.  Neurological:     General: No focal deficit present.     Mental Status: He is alert. Mental status is at baseline.  Psychiatric:        Attention and Perception: Attention normal.        Mood and Affect: Mood normal.        Speech: Speech normal.        Behavior: Behavior normal.        Thought Content: Thought content normal.        Cognition and Memory: Cognition normal.        Judgment: Judgment normal.   Review of Systems  Constitutional: Negative.   HENT: Negative.    Eyes: Negative.   Respiratory: Negative.    Cardiovascular: Negative.   Gastrointestinal: Negative.   Musculoskeletal: Negative.   Skin: Negative.   Neurological: Negative.   Psychiatric/Behavioral: Negative.    Blood pressure 112/75, pulse 77, temperature 98 F (36.7 C), temperature source Oral, resp. rate 18, height 5\' 11"  (1.803 m), weight 95.7 kg, SpO2 100 %. Body mass index is 29.43 kg/m.   Social History   Tobacco Use  Smoking Status Former   Types: Cigarettes   Start date: 07/05/1996   Quit date: 09/23/2014   Years since quitting: 6.8  Smokeless Tobacco Former   Types: Snuff, Chew   Quit date:  07/05/1998   Tobacco Cessation:  A prescription for an FDA-approved tobacco cessation medication provided at discharge   Blood Alcohol level:  Lab Results  Component Value Date   ETH <10 08/10/2021   ETH <10 99991111    Metabolic Disorder Labs:  Lab Results  Component Value Date   HGBA1C 5.0 08/12/2021   MPG 96.8 08/12/2021   No results found for: PROLACTIN Lab Results  Component Value Date   CHOL 169 08/12/2021   TRIG 72 08/12/2021   HDL 34 (L) 08/12/2021   CHOLHDL 5.0 08/12/2021   VLDL 14 08/12/2021   LDLCALC 121 (H) 08/12/2021   [place] 96 08/09/2014    See Psychiatric Specialty Exam and Suicide Risk Assessment completed by Attending Physician prior to  discharge.  Discharge destination:  Home  Is patient on multiple antipsychotic therapies at discharge:  No   Has Patient had three or more failed trials of antipsychotic monotherapy by history:  No  Recommended Plan for Multiple Antipsychotic Therapies: NA  Discharge Instructions     Diet - low sodium heart healthy   Complete by: As directed    Increase activity slowly   Complete by: As directed       Allergies as of 08/15/2021   No Known Allergies      Medication List     TAKE these medications      Indication  benztropine 0.5 MG tablet Commonly known as: COGENTIN Take 1 tablet (0.5 mg total) by mouth 2 (two) times daily.  Indication: Extrapyramidal Reaction caused by Medications   gabapentin 100 MG capsule Commonly known as: NEURONTIN Take 1 capsule (100 mg total) by mouth 3 (three) times daily.  Indication: Fibromyalgia Syndrome   hydrOXYzine 25 MG tablet Commonly known as: ATARAX/VISTARIL Take 1 tablet (25 mg total) by mouth 3 (three) times daily as needed for anxiety.  Indication: Feeling Anxious   lithium carbonate 450 MG CR tablet Commonly known as: ESKALITH Take 1 tablet (450 mg total) by mouth every 12 (twelve) hours.  Indication: Depressive Phase of Manic-Depression    risperiDONE 2 MG tablet Commonly known as: RISPERDAL Take 1 tablet (2 mg total) by mouth 2 (two) times daily.  Indication: MIXED BIPOLAR AFFECTIVE DISORDER         Follow-up recommendations: Continue current medication.  Side effects reviewed.  Stay well hydrated.  Follow up with outpatient provider.  Comments: Prescriptions provided  Signed: Mordecai Rasmussen, MD 08/15/2021, 12:44 PM

## 2021-12-07 IMAGING — CR DG CHEST 2V
3 series · 3 of 3 positions shown · non-contrast
Comparison: 09/24/2019

CLINICAL DATA: Cough

EXAM:
CHEST - 2 VIEW

[chest pa]
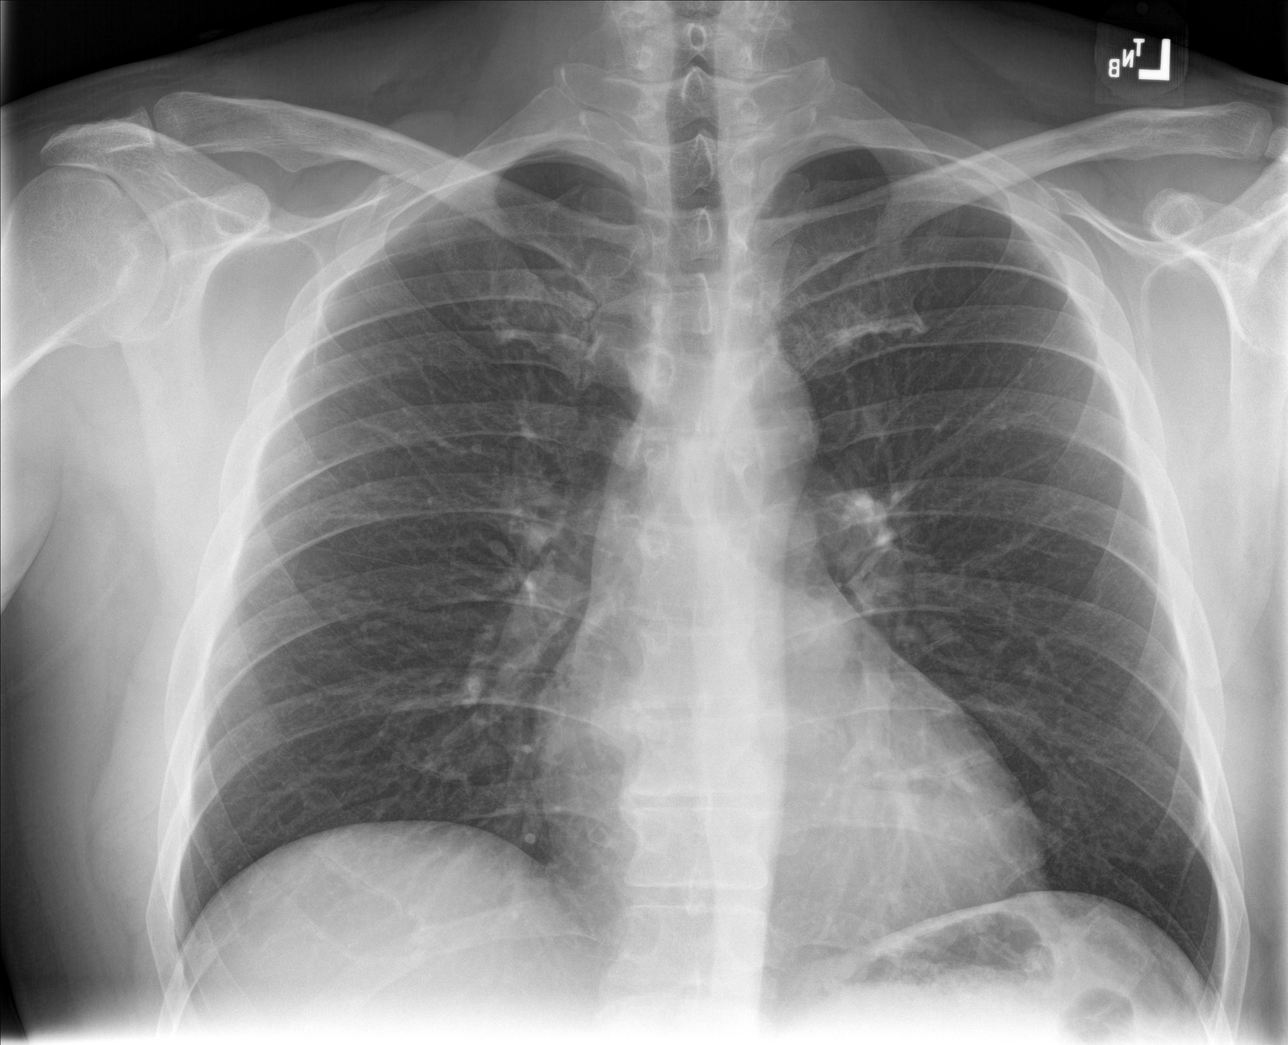

[chest lat (1 of 2)]
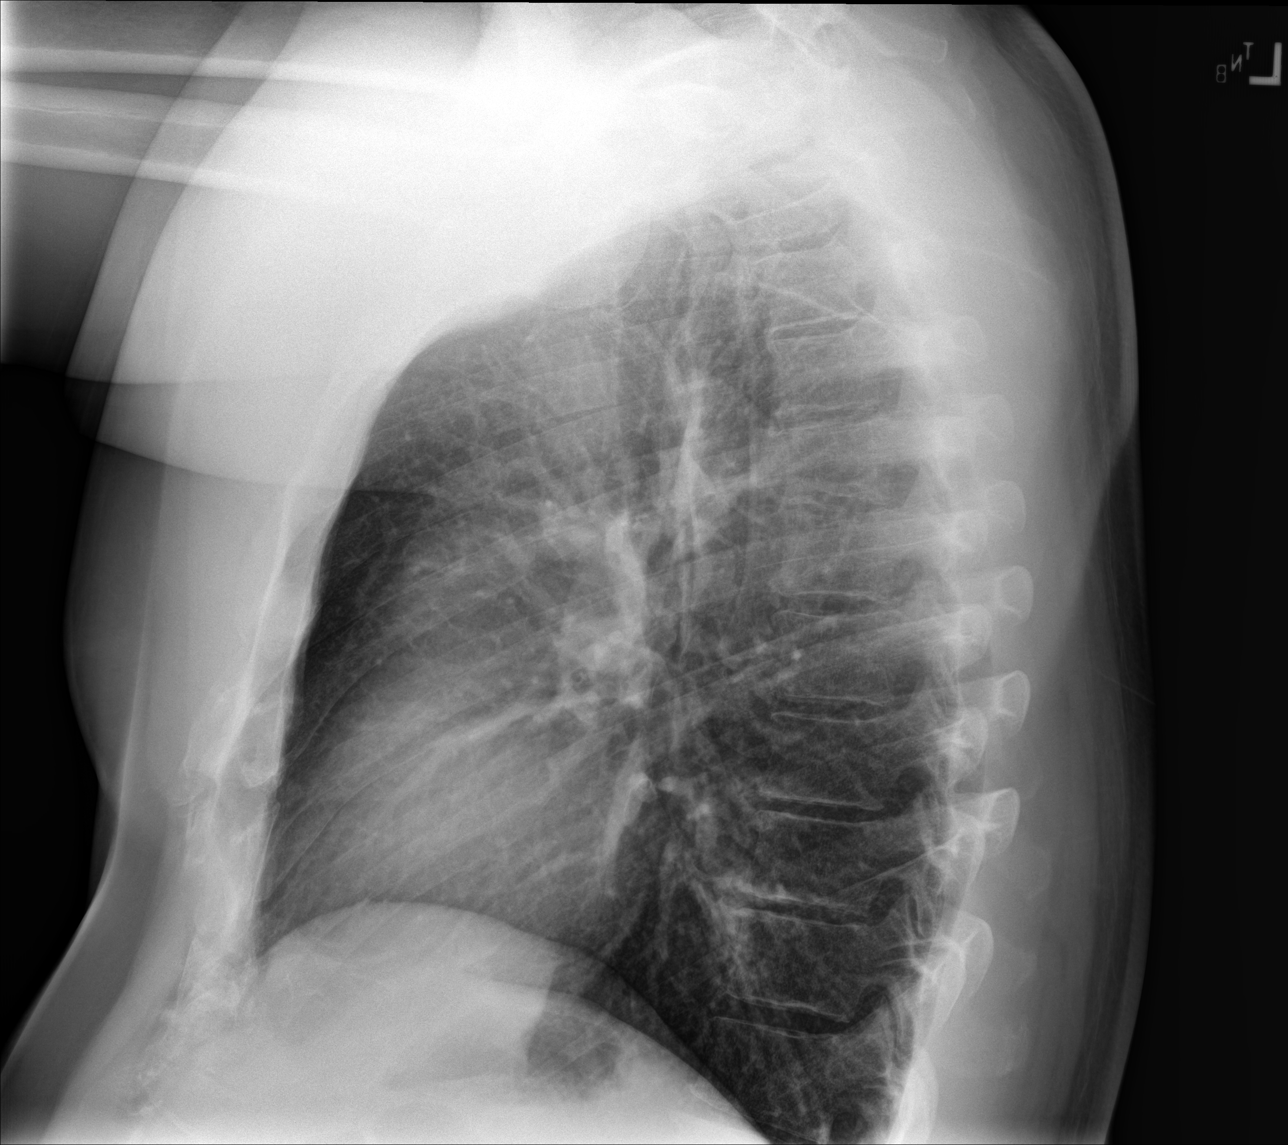

[chest lat (2 of 2)]
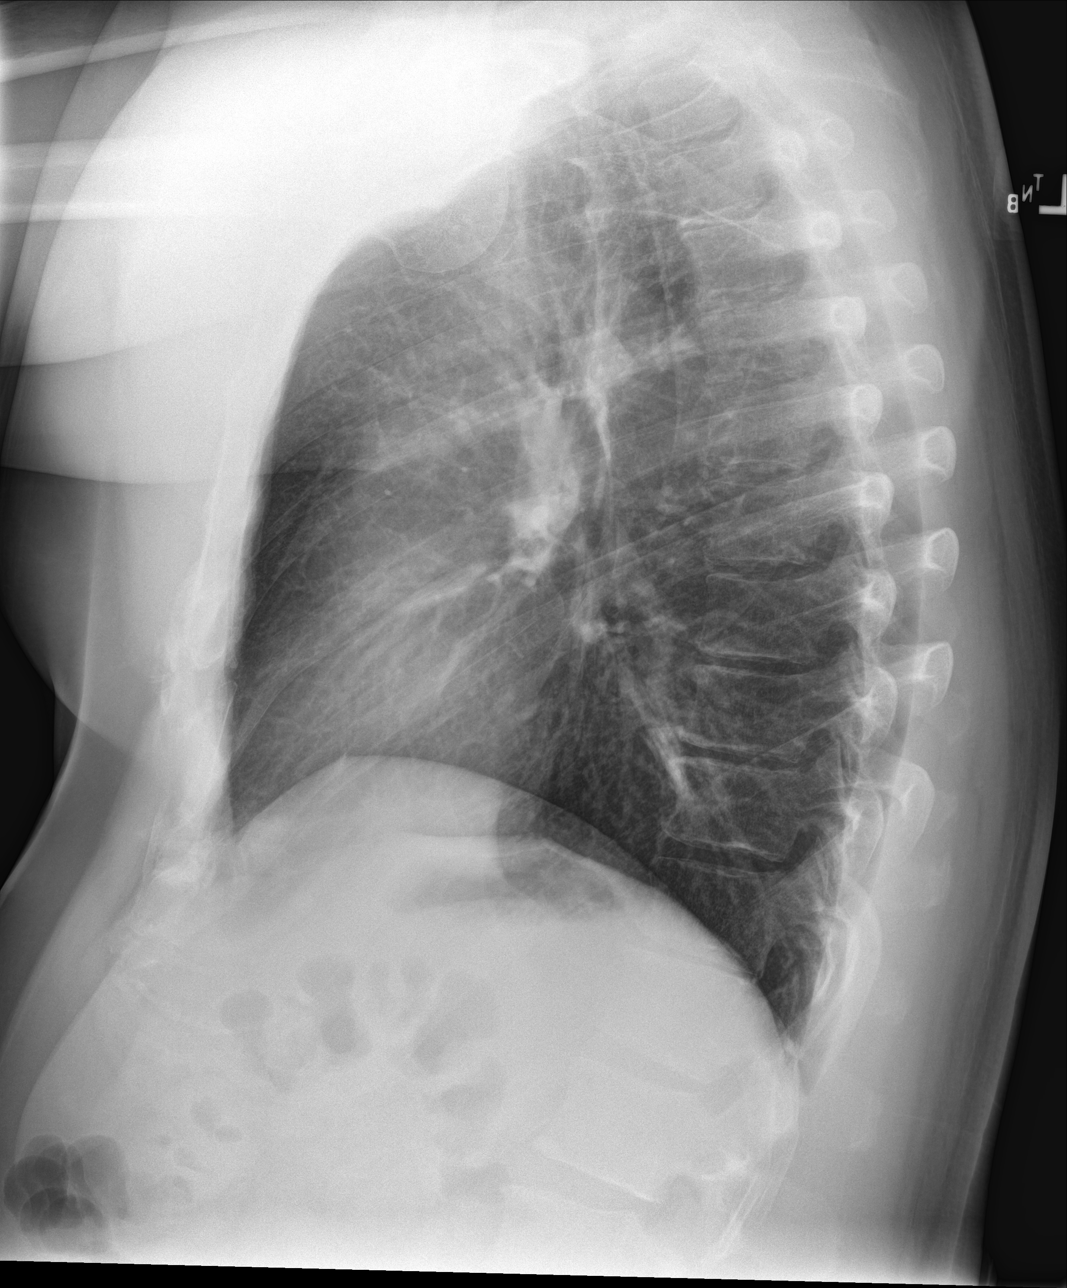

[3 of 3 positions shown; findings below may reference images not displayed]

FINDINGS: The heart size and mediastinal contours are within normal limits.
Both lungs are clear. The visualized skeletal structures are
unremarkable.
IMPRESSION: No active cardiopulmonary disease.

## 2022-07-08 ENCOUNTER — Other Ambulatory Visit: Payer: Self-pay

## 2022-07-08 ENCOUNTER — Emergency Department
Admission: EM | Admit: 2022-07-08 | Discharge: 2022-07-08 | Disposition: A | Discharge status: Other Acute Inpatient Hospital | Payer: No Typology Code available for payment source | Attending: Emergency Medicine | Admitting: Emergency Medicine

## 2022-07-08 DIAGNOSIS — F419 Anxiety disorder, unspecified: Secondary | ICD-10-CM | POA: Diagnosis not present

## 2022-07-08 DIAGNOSIS — F319 Bipolar disorder, unspecified: Secondary | ICD-10-CM | POA: Diagnosis present

## 2022-07-08 DIAGNOSIS — Z87891 Personal history of nicotine dependence: Secondary | ICD-10-CM | POA: Diagnosis not present

## 2022-07-08 DIAGNOSIS — Z1152 Encounter for screening for COVID-19: Secondary | ICD-10-CM | POA: Diagnosis not present

## 2022-07-08 LAB — COMPREHENSIVE METABOLIC PANEL
ALT: 28 U/L (ref 0–44)
AST: 23 U/L (ref 15–41)
Albumin: 5.2 g/dL — ABNORMAL HIGH (ref 3.5–5.0)
Alkaline Phosphatase: 69 U/L (ref 38–126)
Anion gap: 11 (ref 5–15)
BUN: 22 mg/dL — ABNORMAL HIGH (ref 6–20)
CO2: 24 mmol/L (ref 22–32)
Calcium: 9.8 mg/dL (ref 8.9–10.3)
Chloride: 103 mmol/L (ref 98–111)
Creatinine, Ser: 1.19 mg/dL (ref 0.61–1.24)
GFR, Estimated: >60 mL/min (ref >60)
Glucose, Bld: 135 mg/dL — ABNORMAL HIGH (ref 70–99)
Potassium: 3.7 mmol/L (ref 3.5–5.1)
Sodium: 138 mmol/L (ref 135–145)
Total Bilirubin: 2.2 mg/dL — ABNORMAL HIGH (ref 0.3–1.2)
Total Protein: 8.6 g/dL — ABNORMAL HIGH (ref 6.5–8.1)

## 2022-07-08 LAB — RESP PANEL BY RT-PCR (FLU A&B, COVID) ARPGX2
Influenza A by PCR: NEGATIVE
Influenza B by PCR: NEGATIVE
SARS Coronavirus 2 by RT PCR: NEGATIVE

## 2022-07-08 LAB — URINE DRUG SCREEN, QUALITATIVE (ARMC ONLY)
Amphetamines, Ur Screen: NOT DETECTED
Barbiturates, Ur Screen: NOT DETECTED
Benzodiazepine, Ur Scrn: NOT DETECTED
Cannabinoid 50 Ng, Ur ~~LOC~~: NOT DETECTED
Cocaine Metabolite,Ur ~~LOC~~: NOT DETECTED
MDMA (Ecstasy)Ur Screen: NOT DETECTED
Methadone Scn, Ur: NOT DETECTED
Opiate, Ur Screen: NOT DETECTED
Phencyclidine (PCP) Ur S: NOT DETECTED
Tricyclic, Ur Screen: NOT DETECTED

## 2022-07-08 LAB — CBC
HCT: 46.8 % (ref 39.0–52.0)
Hemoglobin: 15.5 g/dL (ref 13.0–17.0)
MCH: 30.3 pg (ref 26.0–34.0)
MCHC: 33.1 g/dL (ref 30.0–36.0)
MCV: 91.6 fL (ref 80.0–100.0)
Platelets: 334 10*3/uL (ref 150–400)
RBC: 5.11 MIL/uL (ref 4.22–5.81)
RDW: 12.2 % (ref 11.5–15.5)
WBC: 8.6 10*3/uL (ref 4.0–10.5)
nRBC: 0 % (ref 0.0–0.2)

## 2022-07-08 LAB — SALICYLATE LEVEL: Salicylate Lvl: <7 mg/dL — ABNORMAL LOW (ref 7.0–30.0)

## 2022-07-08 LAB — LITHIUM LEVEL: Lithium Lvl: <0.06 mmol/L — ABNORMAL LOW (ref 0.60–1.20)

## 2022-07-08 LAB — ACETAMINOPHEN LEVEL: Acetaminophen (Tylenol), Serum: <10 ug/mL — ABNORMAL LOW (ref 10–30)

## 2022-07-08 LAB — ETHANOL: Alcohol, Ethyl (B): <10 mg/dL (ref <10)

## 2022-07-08 NOTE — BH Assessment (Addendum)
Patient has been accepted to [facility] today 07/08/22.  Patient assigned to [facility]. Accepting physician is Dr. Jonelle Sports.  Call report to [phone].  Representative was [employer].   ER Staff is aware of it:  Lattie Haw, ER Secretary  Dr. Cinda Quest, ER MD  Claiborne Billings, Patient's Nurse

## 2022-07-08 NOTE — ED Notes (Signed)
EMTALA reviewed and all required documentation is up to date. Pt is ready for transport.  

## 2022-07-08 NOTE — ED Notes (Signed)
Report to Kelly, RN.

## 2022-07-08 NOTE — ED Notes (Signed)
SAFE  TRANSPORT    CALLED  FOR  PT  GOING  TO  [facility]

## 2022-07-08 NOTE — ED Provider Notes (Signed)
   [facility] Provider Note    Event Date/Time   First MD Initiated Contact with Patient 07/08/22 [phone]     (approximate)   History   Psychiatric Evaluation   HPI  Robert Holmes is a 41 y.o. male   Past medical history of bipolar, depression, panic attacks, here with worsening mental health symptoms including anxiety, audio hallucinations, paranoia. Has been compliant w meds. No suicidality, homicidality. No self harm today or ingestion. No drugs/alcohol use.   Here voluntarily for pysch help.   No other medical complaints.   History was obtained via patient and review of external medical notes including dc summary 2022 for psych.       Physical Exam   Triage Vital Signs: ED Triage Vitals  Enc Vitals Group     BP 07/08/22 0639 (!) 152/100     Pulse Rate 07/08/22 0639 97     Resp 07/08/22 0639 (!) 22     Temp 07/08/22 0639 97.6 F (36.4 C)     Temp Source 07/08/22 0639 Oral     SpO2 07/08/22 0639 100 %     Weight 07/08/22 0619 230 lb (104.3 kg)     Height 07/08/22 0619 5\' 11"  (1.803 m)     Head Circumference --      Peak Flow --      Pain Score 07/08/22 0619 0     Pain Loc --      Pain Edu? --      Excl. in [place]? --     Most recent vital signs: Vitals:   07/08/22 0639  BP: (!) 152/100  Pulse: 97  Resp: (!) 22  Temp: 97.6 F (36.4 C)  SpO2: 100%    General: Awake, no distress. Anxious appearing CV:  Good peripheral perfusion. Resp:  Normal effort. Clear breath sounds.  Abd:  No distention.  Other:  A&O, cooperative   ED Results / Procedures / Treatments   Labs (all labs ordered are listed, but only abnormal results are displayed) Labs Reviewed  CBC  COMPREHENSIVE METABOLIC PANEL  ETHANOL  SALICYLATE LEVEL  ACETAMINOPHEN LEVEL  URINE DRUG SCREEN, QUALITATIVE ([place])     I reviewed labs and they are notable for normal WBC    PROCEDURES:  Critical Care performed: No  Procedures   MEDICATIONS ORDERED  IN ED: Medications - No data to display   IMPRESSION / MDM / [place] / ED COURSE  I reviewed the triage vital signs and the nursing notes.                              Differential diagnosis includes, but is not limited to, decompensated psychiatric illness, less likely organic causes    MDM:  medically cleared for psychiatric evaluation, voluntary (no si, clear thought process, no clear indication for involuntary commitment at this time)    Patient's presentation is most consistent with acute presentation with potential threat to life or bodily function.       FINAL CLINICAL IMPRESSION(S) / ED DIAGNOSES   Final diagnoses:  Anxiety     Rx / DC Orders   ED Discharge Orders     None        Note:  This document was prepared using Dragon voice recognition software and may include unintentional dictation errors.    Lucillie Garfinkel, MD 07/08/22 [phone]

## 2022-07-08 NOTE — ED Notes (Signed)
Meal was given to pt 11:33 

## 2022-07-08 NOTE — ED Notes (Signed)
Paper consent for transport signed. 1 copy made and 1 copy sent with transport services.

## 2022-07-08 NOTE — BH Assessment (Signed)
Comprehensive Clinical Assessment (CCA) Note  07/08/2022 Robert Holmes [id number]  Robert Holmes, 41 year old male who presents to [facility] ED involuntarily for treatment. Per triage note, Patient states having problems at home and feels like bipolar is acting up.   During TTS assessment pt presents alert and oriented x 4, restless but cooperative, and mood-congruent with affect. The pt does not appear to be responding to internal or external stimuli. Neither is the pt presenting with any delusional thinking. Pt verified the information provided to triage RN.   Pt identifies his main complaint to be that he is having regrets about things he did in the past. Patient states he should be in jail but would not say why. Patient reports he has been off medications for 2 days as he was trying to space them out because he is "running low". Patient reports having auditory hallucinations that talk about life and what he should be doing. Patient reports he is paranoid that he has disappointed a lot of people. When asked patient described his mood as "bland".  Patient reports having poor sleep. Pt denies current SI/HI/VH. Pt is unable to contract for safety. "I really don't feel well."    Per Sallye Ober, NP, pt is recommended for inpatient psychiatric admission.    Chief Complaint:  Chief Complaint  Patient presents with   Psychiatric Evaluation   Visit Diagnosis: Bipolar 1 disorder    CCA Screening, Triage and Referral (STR)  Patient Reported Information How did you hear about [place]? Self  Referral name: No data recorded Referral phone number: No data recorded  Whom do you see for routine medical problems? No data recorded Practice/Facility Name: No data recorded Practice/Facility Phone Number: No data recorded Name of Contact: No data recorded Contact Number: No data recorded Contact Fax Number: No data recorded Prescriber Name: No data recorded Prescriber Address (if known): No data  recorded  What Is the Reason for Your Visit/Call Today? Patient reports his bipolar is "acting up."  How Long Has This Been Causing You Problems? 1 wk - 1 month  What Do You Feel Would Help You the Most Today? Treatment for Depression or other mood problem; Medication(s)   Have You Recently Been in Any Inpatient Treatment (Hospital/Detox/Crisis Center/28-Day Program)? No data recorded Name/Location of Program/Hospital:No data recorded How Long Were You There? No data recorded When Were You Discharged? No data recorded  Have You Ever Received Services From [facility] Before? No data recorded Who Do You See at [facility]? No data recorded  Have You Recently Had Any Thoughts About Hurting Yourself? No  Are You Planning to Commit Suicide/Harm Yourself At This time? No   Have you Recently Had Thoughts About Hurting Someone Karolee Ohs? No  Explanation: No data recorded  Have You Used Any Alcohol or Drugs in the Past 24 Hours? No  How Long Ago Did You Use Drugs or Alcohol? No data recorded What Did You Use and How Much? No data recorded  Do You Currently Have a Therapist/Psychiatrist? No  Name of Therapist/Psychiatrist: No data recorded  Have You Been Recently Discharged From Any Office Practice or Programs? No  Explanation of Discharge From Practice/Program: No data recorded    CCA Screening Triage Referral Assessment Type of Contact: Face-to-Face  Is this Initial or Reassessment? No data recorded Date Telepsych consult ordered in [facility]:  No data recorded Time Telepsych consult ordered in [facility]:  No data recorded  Patient Reported Information Reviewed? No data recorded Patient  Left Without Being Seen? No data recorded Reason for Not Completing Assessment: No data recorded  Collateral Involvement: None provided   Does Patient Have a Court Appointed Legal Guardian? No data recorded Name and Contact of Legal Guardian: No data recorded If Minor and Not Living with Parent(s), Who  has Custody? n/a  Is CPS involved or ever been involved? Never  Is APS involved or ever been involved? Never   Patient Determined To Be At Risk for Harm To Self or Others Based on Review of Patient Reported Information or Presenting Complaint? No  Method: No data recorded Availability of Means: No data recorded Intent: No data recorded Notification Required: No data recorded Additional Information for Danger to Others Potential: No data recorded Additional Comments for Danger to Others Potential: No data recorded Are There Guns or Other Weapons in Your Home? No data recorded Types of Guns/Weapons: No data recorded Are These Weapons Safely Secured?                            No data recorded Who Could Verify You Are Able To Have These Secured: No data recorded Do You Have any Outstanding Charges, Pending Court Dates, Parole/Probation? No data recorded Contacted To Inform of Risk of Harm To Self or Others: No data recorded  Location of Assessment: [facility] ED   Does Patient Present under Involuntary Commitment? No  IVC Papers Initial File Date: 08/10/21   [place] of Residence: [place]   Patient Currently Receiving the Following Services: Not Receiving Services   Determination of Need: Emergent (2 hours)   Options For Referral: ED Visit; Inpatient Hospitalization; Medication Management     CCA Biopsychosocial Intake/Chief Complaint:  No data recorded Current Symptoms/Problems: No data recorded  Patient Reported Schizophrenia/Schizoaffective Diagnosis in Past: No   Strengths: Patient is able to communicate and verbalize needs.  Preferences: No data recorded Abilities: No data recorded  Type of Services Patient Feels are Needed: No data recorded  Initial Clinical Notes/Concerns: No data recorded  Mental Health Symptoms Depression:   Change in energy/activity; Hopelessness; Increase/decrease in appetite; Sleep (too much or little); Worthlessness   Duration of  Depressive symptoms:  Greater than two weeks   Mania:   Racing thoughts   Anxiety:    Restlessness; Sleep; Tension; Worrying; Difficulty concentrating   Psychosis:   Hallucinations   Duration of Psychotic symptoms:  Less than six months   Trauma:   N/A   Obsessions:   Cause anxiety; Disrupts routine/functioning; Intrusive/time consuming; Poor insight   Compulsions:   N/A   Inattention:   N/A   Hyperactivity/Impulsivity:   N/A   Oppositional/Defiant Behaviors:   N/A   Emotional Irregularity:   N/A   Other Mood/Personality Symptoms:  No data recorded   Mental Status Exam Appearance and self-care  Stature:   Average   Weight:   Average weight   Clothing:   Age-appropriate   Grooming:   Normal   Cosmetic use:   None   Posture/gait:   Slumped   Motor activity:   Not Remarkable   Sensorium  Attention:   Normal   Concentration:   Focuses on irrelevancies; Scattered   Orientation:   X5   Recall/memory:   Normal   Affect and Mood  Affect:   Flat; Depressed   Mood:   Anxious; Depressed   Relating  Eye contact:   Fleeting   Facial expression:   Constricted; Sad; Tense; Anxious  Attitude toward examiner:   Cooperative   Thought and Language  Speech flow:  Clear and Coherent; Normal   Thought content:   Appropriate to Mood and Circumstances   Preoccupation:   Obsessions; Guilt   Hallucinations:   Auditory   Organization:  No data recorded  [employer] of Knowledge:   Fair   Intelligence:   Average   Abstraction:   Functional   Judgement:   Impaired   Reality Testing:   Distorted   Insight:   Poor   Decision Making:   Paralyzed   Social Functioning  Social Maturity:   Isolates   Social Judgement:   Heedless   Stress  Stressors:   Relationship; Family conflict   Coping Ability:   Programme researcher, broadcasting/film/video Deficits:   None   Supports:   Family     Religion:     Leisure/Recreation:    Exercise/Diet: Exercise/Diet Do You Have Any Trouble Sleeping?: Yes Explanation of Sleeping Difficulties: Patient reports having poor sleep habits.   CCA Employment/Education Employment/Work Situation: Employment / Work Situation Employment Situation: Employed Patient's Job has Been Impacted by Current Illness: No Has Patient ever Been in Passenger transport manager?: No  Education:     CCA Family/Childhood History Family and Relationship History: Family history Does patient have children?: Yes How many children?: 3 How is patient's relationship with their children?: Patient states his wages are garnished for child support.  Childhood History:     Child/Adolescent Assessment:     CCA Substance Use Alcohol/Drug Use: Alcohol / Drug Use Pain Medications: See PTA Prescriptions: See PTA Over the Counter: See PTA History of alcohol / drug use?: Yes Longest period of sobriety (when/how long): "Some Years" Negative Consequences of Use: Financial, Personal relationships Substance #1 Name of Substance 1: Marijuana                       ASAM's:  Six Dimensions of Multidimensional Assessment  Dimension 1:  Acute Intoxication and/or Withdrawal Potential:      Dimension 2:  Biomedical Conditions and Complications:      Dimension 3:  Emotional, Behavioral, or Cognitive Conditions and Complications:     Dimension 4:  Readiness to Change:     Dimension 5:  Relapse, Continued use, or Continued Problem Potential:     Dimension 6:  Recovery/Living Environment:     ASAM Severity Score:    ASAM Recommended Level of Treatment:     Substance use Disorder (SUD)    Recommendations for Services/Supports/Treatments: Recommendations for Services/Supports/Treatments Recommendations For Services/Supports/Treatments: Medication Management, Inpatient Hospitalization  DSM5 Diagnoses: Patient Active Problem List   Diagnosis Date Noted   Bipolar 1 disorder  ([place]) 08/11/2021   Bipolar affective disorder, mixed, severe, with psychotic behavior ([place]) 08/10/2021   GERD 08/06/2010   PANCREATITIS 08/06/2010   EPIGASTRIC PAIN 08/06/2010    Patient Centered Plan: Patient is on the following Treatment Plan(s):  Depression   Referrals to Alternative Service(s): Referred to Alternative Service(s):   Place:   Date:   Time:    Referred to Alternative Service(s):   Place:   Date:   Time:    Referred to Alternative Service(s):   Place:   Date:   Time:    Referred to Alternative Service(s):   Place:   Date:   Time:      @BHCOLLABOFCARE @  [place], Counselor, LCAS-A

## 2022-07-08 NOTE — ED Notes (Addendum)
Patient dressed out by this RN and Linus Orn, EDT.  Belongings placed in labeled belongings bag to be secured on the unit.  1 pair jeans 1 pair black colored tennis shoes 1 blue colored t-shirt 1 pair white colored socks Black colored boxer briefs Clear safety glasses Multiple keys Cell phone Struthers belt Work Pharmacist, hospital labeled and put in security safe

## 2022-07-08 NOTE — Consult Note (Addendum)
[facility] Face-to-Face Psychiatry Consult   Reason for Consult: "Bipolar acting up" Referring Physician: Modesto Charon Patient Identification: Robert Holmes MRN:  [id number] Principal Diagnosis: Bipolar 1 disorder (HCC) Diagnosis:  Principal Problem:   Bipolar 1 disorder (HCC)   Total Time spent with patient: 30 minutes  Subjective: "I did wrong." Robert Holmes is a 41 y.o. male patient admitted with .Anxiety, auditory hallucinations  HPI:  Patient presents voluntarily to the ED complaining of "bipolar acting up."  On evaluation, patient appears sad, slow movement. He states that he has done things wrong and he has a lot of regrets. He reports thinking he needs to be in jail, but fails to tell us why. He states he is off his medications for 2 days because he has been trying to "space it out." He reports auditory hallucinations of hearing negative comments about himself. He says he is "paranoid" that he has disappointed a lot of people. He denies visual hallucinations. He describes his mood as "bland" and his affect is very flat.   Denies suicidal or homicidal thoughts. Reports poor sleep for several days,   Past Psychiatric History: bipolar affective disorder; bipolar 1 disorder  Risk to Self:   Risk to Others:   Prior Inpatient Therapy:   Prior Outpatient Therapy:    Past Medical History:  Past Medical History:  Diagnosis Date   Bipolar disorder (HCC)    Depression    Panic attack     Past Surgical History:  Procedure Laterality Date   TONSILLECTOMY     Family History:  Family History  Problem Relation Age of Onset   Anxiety disorder Mother    Depression Mother    Panic disorder Mother    Hypertension Father    Stroke Father    Diabetes Paternal Grandfather    Family Psychiatric  History:  Social History:  Social History   Substance and Sexual Activity  Alcohol Use No   Alcohol/week: 0.0 standard drinks of alcohol     Social History   Substance and Sexual  Activity  Drug Use No    Social History   Socioeconomic History   Marital status: Married    Spouse name: Not on file   Number of children: Not on file   Years of education: Not on file   Highest education level: Not on file  Occupational History   Not on file  Tobacco Use   Smoking status: Former    Types: Cigarettes    Start date: 07/05/1996    Quit date: 09/23/2014    Years since quitting: 7.7   Smokeless tobacco: Former    Types: Snuff, Dorna Bloom    Quit date: 07/05/1998  Vaping Use   Vaping Use: Never used  Substance and Sexual Activity   Alcohol use: No    Alcohol/week: 0.0 standard drinks of alcohol   Drug use: No   Sexual activity: Not Currently  Other Topics Concern   Not on file  Social History Narrative   Not on file   Social Determinants of Health   Financial Resource Strain: Not on file  Food Insecurity: Not on file  Transportation Needs: Not on file  Physical Activity: Not on file  Stress: Not on file  Social Connections: Not on file   Additional Social History:    Allergies:  No Known Allergies  Labs:  Results for orders placed or performed during the hospital encounter of 07/08/22 (from the past 48 hour(s))  Comprehensive metabolic panel  Status: Abnormal   Collection Time: 07/08/22  6:40 AM  Result Value Ref Range   Sodium 138 135 - 145 mmol/L   Potassium 3.7 3.5 - 5.1 mmol/L   Chloride 103 98 - 111 mmol/L   CO2 24 22 - 32 mmol/L   Glucose, Bld 135 (H) 70 - 99 mg/dL    Comment: Glucose reference range applies only to samples taken after fasting for at least 8 hours.   BUN 22 (H) 6 - 20 mg/dL   Creatinine, Ser 3.01 0.61 - 1.24 mg/dL   Calcium 9.8 8.9 - 60.1 mg/dL   Total Protein 8.6 (H) 6.5 - 8.1 g/dL   Albumin 5.2 (H) 3.5 - 5.0 g/dL   AST 23 15 - 41 U/L   ALT 28 0 - 44 U/L   Alkaline Phosphatase 69 38 - 126 U/L   Total Bilirubin 2.2 (H) 0.3 - 1.2 mg/dL   GFR, Estimated >09 >32 mL/min    Comment: (NOTE) Calculated using the CKD-EPI  Creatinine Equation (2021)    Anion gap 11 5 - 15    Comment: Performed at [facility], [address] [place], [place] [address]  Ethanol     Status: None   Collection Time: 07/08/22  6:40 AM  Result Value Ref Range   Alcohol, Ethyl (B) <10 <10 mg/dL    Comment: (NOTE) Lowest detectable limit for serum alcohol is 10 mg/dL.  For medical purposes only. Performed at [facility], [address] [place], [place] [address]   Salicylate level     Status: Abnormal   Collection Time: 07/08/22  6:40 AM  Result Value Ref Range   Salicylate Lvl <7.0 (L) 7.0 - 30.0 mg/dL    Comment: Performed at [facility], [address] [place], [place] [address]  Acetaminophen level     Status: Abnormal   Collection Time: 07/08/22  6:40 AM  Result Value Ref Range   Acetaminophen (Tylenol), Serum <10 (L) 10 - 30 ug/mL    Comment: (NOTE) Therapeutic concentrations vary significantly. A range of 10-30 ug/mL  may be an effective concentration for many patients. However, some  are best treated at concentrations outside of this range. Acetaminophen concentrations >150 ug/mL at 4 hours after ingestion  and >50 ug/mL at 12 hours after ingestion are often associated with  toxic reactions.  Performed at [facility], [address] [place], [place] [address]   cbc     Status: None   Collection Time: 07/08/22  6:40 AM  Result Value Ref Range   WBC 8.6 4.0 - 10.5 K/uL   RBC 5.11 4.22 - 5.81 MIL/uL   Hemoglobin 15.5 13.0 - 17.0 g/dL   HCT 83.1 51.7 - 61.6 %   MCV 91.6 80.0 - 100.0 fL   MCH 30.3 26.0 - 34.0 pg   MCHC 33.1 30.0 - 36.0 g/dL   RDW 07.3 71.0 - 62.6 %   Platelets 334 150 - 400 K/uL   nRBC 0.0 0.0 - 0.2 %    Comment: Performed at [facility], [address], [place], [place] [address]  Urine Drug Screen, Qualitative     Status: None   Collection Time: 07/08/22  6:54 AM  Result Value Ref Range   Tricyclic, Ur Screen NONE DETECTED NONE  DETECTED   Amphetamines, Ur Screen NONE DETECTED NONE DETECTED   MDMA (Ecstasy)Ur Screen NONE DETECTED NONE DETECTED   Cocaine Metabolite,Ur [place] NONE DETECTED NONE DETECTED   Opiate, Ur Screen  NONE DETECTED NONE DETECTED   Phencyclidine (PCP) Ur S NONE DETECTED NONE DETECTED   Cannabinoid 50 Ng, Ur [place] NONE DETECTED NONE DETECTED   Barbiturates, Ur Screen NONE DETECTED NONE DETECTED   Benzodiazepine, Ur Scrn NONE DETECTED NONE DETECTED   Methadone Scn, Ur NONE DETECTED NONE DETECTED    Comment: (NOTE) Tricyclics + metabolites, urine    Cutoff 1000 ng/mL Amphetamines + metabolites, urine  Cutoff 1000 ng/mL MDMA (Ecstasy), urine              Cutoff 500 ng/mL Cocaine Metabolite, urine          Cutoff 300 ng/mL Opiate + metabolites, urine        Cutoff 300 ng/mL Phencyclidine (PCP), urine         Cutoff 25 ng/mL Cannabinoid, urine                 Cutoff 50 ng/mL Barbiturates + metabolites, urine  Cutoff 200 ng/mL Benzodiazepine, urine              Cutoff 200 ng/mL Methadone, urine                   Cutoff 300 ng/mL  The urine drug screen provides only a preliminary, unconfirmed analytical test result and should not be used for non-medical purposes. Clinical consideration and professional judgment should be applied to any positive drug screen result due to possible interfering substances. A more specific alternate chemical method must be used in order to obtain a confirmed analytical result. Gas chromatography / mass spectrometry (GC/MS) is the preferred confirm atory method. Performed at [facility], [address] [place], [place] [id number]   Resp Panel by RT-PCR (Flu A&B, Covid) Anterior Nasal Swab     Status: None   Collection Time: 07/08/22  8:25 AM   Specimen: Anterior Nasal Swab  Result Value Ref Range   SARS Coronavirus 2 by RT PCR NEGATIVE NEGATIVE    Comment: (NOTE) SARS-CoV-2 target nucleic acids are NOT DETECTED.  The SARS-CoV-2 RNA is generally detectable in  upper respiratory specimens during the acute phase of infection. The lowest concentration of SARS-CoV-2 viral copies this assay can detect is 138 copies/mL. A negative result does not preclude SARS-Cov-2 infection and should not be used as the sole basis for treatment or other patient management decisions. A negative result may occur with  improper specimen collection/handling, submission of specimen other than nasopharyngeal swab, presence of viral mutation(s) within the areas targeted by this assay, and inadequate number of viral copies(<138 copies/mL). A negative result must be combined with clinical observations, patient history, and epidemiological information. The expected result is Negative.  Fact Sheet for Patients:  BloggerCourse.comhttps://www.fda.gov/media/152166/download  Fact Sheet for Healthcare Providers:  SeriousBroker.ithttps://www.fda.gov/media/152162/download  This test is no t yet approved or cleared by the [place] FDA and  has been authorized for detection and/or diagnosis of SARS-CoV-2 by FDA under an Emergency Use Authorization (EUA). This EUA will remain  in effect (meaning this test can be used) for the duration of the COVID-19 declaration under Section 564(b)(1) of the Act, 21 U.S.C.section 360bbb-3(b)(1), unless the authorization is terminated  or revoked sooner.       Influenza A by PCR NEGATIVE NEGATIVE   Influenza B by PCR NEGATIVE NEGATIVE    Comment: (NOTE) The Xpert Xpress SARS-CoV-2/FLU/RSV plus assay is intended as an aid in the diagnosis of influenza from Nasopharyngeal swab specimens and should not be used as a sole basis for treatment.  Nasal washings and aspirates are unacceptable for Xpert Xpress SARS-CoV-2/FLU/RSV testing.  Fact Sheet for Patients: BloggerCourse.com  Fact Sheet for Healthcare Providers: SeriousBroker.it  This test is not yet approved or cleared by the [place] FDA and has been authorized  for detection and/or diagnosis of SARS-CoV-2 by FDA under an Emergency Use Authorization (EUA). This EUA will remain in effect (meaning this test can be used) for the duration of the COVID-19 declaration under Section 564(b)(1) of the Act, 21 U.S.C. section 360bbb-3(b)(1), unless the authorization is terminated or revoked.  Performed at [facility], [address] [place], [place] [address]     No current facility-administered medications for this encounter.   Current Outpatient Medications  Medication Sig Dispense Refill   benztropine (COGENTIN) 0.5 MG tablet Take 1 tablet (0.5 mg total) by mouth 2 (two) times daily. (Patient not taking: Reported on 07/08/2022) 60 tablet 1   gabapentin (NEURONTIN) 100 MG capsule Take 1 capsule (100 mg total) by mouth 3 (three) times daily. (Patient not taking: Reported on 07/08/2022) 90 capsule 1   hydrOXYzine (ATARAX/VISTARIL) 25 MG tablet Take 1 tablet (25 mg total) by mouth 3 (three) times daily as needed for anxiety. (Patient not taking: Reported on 07/08/2022) 30 tablet 1   lithium carbonate (ESKALITH) 450 MG CR tablet Take 1 tablet (450 mg total) by mouth every 12 (twelve) hours. (Patient not taking: Reported on 07/08/2022) 60 tablet 1   risperiDONE (RISPERDAL) 2 MG tablet Take 1 tablet (2 mg total) by mouth 2 (two) times daily. (Patient not taking: Reported on 07/08/2022) 60 tablet 1    Musculoskeletal: Strength & Muscle Tone: within normal limits Gait & Station: normal Patient leans: N/A  Psychiatric Specialty Exam:  Presentation  General Appearance: Casual  Eye Contact:Good  Speech:Clear and Coherent  Speech Volume:Normal  Handedness:No data recorded  Mood and Affect  Mood:Depressed; Dysphoric  Affect:Blunt   Thought Process  Thought Processes:Linear  Descriptions of Associations:Intact  Orientation:Full (Time, Place and Person)  Thought Content:WDL  History of Schizophrenia/Schizoaffective  disorder:No  Duration of Psychotic Symptoms:Less than six months  Hallucinations:Hallucinations: Auditory Description of Auditory Hallucinations: "negative things about how my life is"  Ideas of Reference:Paranoia  Suicidal Thoughts:Suicidal Thoughts: No  Homicidal Thoughts:Homicidal Thoughts: No   Sensorium  Memory:Immediate Good  Judgment:Good  Insight:None   Executive Functions  Concentration:Good  Attention Span:Good  Recall:Good  Fund of Knowledge:Fair  Language:Fair   Psychomotor Activity  Psychomotor Activity:Psychomotor Activity: Normal   Assets  Assets:Housing; Physical Health; Resilience   Sleep  Sleep:Sleep: Poor   Physical Exam: Physical Exam Vitals and nursing note reviewed.  HENT:     Head: Normocephalic.     Nose: No congestion or rhinorrhea.  Eyes:     General:        Right eye: No discharge.        Left eye: No discharge.  Cardiovascular:     Rate and Rhythm: Normal rate.  Pulmonary:     Effort: Pulmonary effort is normal.  Musculoskeletal:        General: Normal range of motion.     Cervical back: Normal range of motion.  Skin:    General: Skin is dry.  Neurological:     Mental Status: He is alert and oriented to person, place, and time.  Psychiatric:        Attention and Perception: Attention normal.        Mood and Affect: Mood is depressed. Affect is inappropriate.  Speech: Speech normal.        Behavior: Behavior is cooperative.        Thought Content: Thought content is paranoid.        Cognition and Memory: Cognition normal.        Judgment: Judgment normal.    Review of Systems  HENT: Negative.    Respiratory: Negative.    Psychiatric/Behavioral:  Positive for depression. Negative for hallucinations, memory loss, substance abuse and suicidal ideas. The patient is nervous/anxious and has insomnia.    Blood pressure (!) 152/100, pulse 97, temperature 97.6 F (36.4 C), temperature source Oral, resp. rate  (!) 22, height 5\' 11"  (1.803 m), weight 104.3 kg, SpO2 100 %. Body mass index is 32.08 kg/m.  Treatment Plan Summary: Daily contact with patient to assess and evaluate symptoms and progress in treatment, Medication management, and Plan : Restarted medications: Benzatropine 0.5 mg twice daily; gabapentin 100 mg 3 times daily; hydroxyzine 25 mg 3 times daily as needed for anxiety; risperidone 2 mg 3 times daily  Disposition: Recommend psychiatric Inpatient admission when medically cleared. Supportive therapy provided about ongoing stressors.  Sherlon Handing, NP 07/08/2022 11:55 AM

## 2022-07-08 NOTE — BH Assessment (Signed)
Adult MH  Referral information for Psychiatric Hospitalization faxed to:   [facility] ([phone]-or- [phone]),   [facility] ([phone]),   [facility] ([phone] -or- [phone]),   [facility] ([phone]---[phone]---[phone]),   [facility] ([phone] or [phone])   [facility] ([phone] or [phone]),   [facility] ([phone]) 

## 2022-07-08 NOTE — ED Triage Notes (Signed)
Patient states having problems at home and feels like bipolar is acting up.
